# Patient Record
Sex: Male | Born: 1940 | Race: White | Hispanic: No | Smoking: Former smoker
Health system: Southern US, Community
[De-identification: ages and names within clinical notes are randomized; demographics above are authoritative.]

## PROBLEM LIST (undated history)

## (undated) DIAGNOSIS — C801 Malignant (primary) neoplasm, unspecified: Secondary | ICD-10-CM

## (undated) DIAGNOSIS — F0390 Unspecified dementia without behavioral disturbance: Secondary | ICD-10-CM

---

## 2000-08-13 ENCOUNTER — Ambulatory Visit (HOSPITAL_COMMUNITY): Admission: RE | Admit: 2000-08-13 | Discharge: 2000-08-13 | Payer: Self-pay | Admitting: Ophthalmology

## 2004-10-31 ENCOUNTER — Ambulatory Visit (HOSPITAL_COMMUNITY): Admission: RE | Admit: 2004-10-31 | Discharge: 2004-10-31 | Payer: Self-pay | Admitting: Internal Medicine

## 2004-11-02 ENCOUNTER — Ambulatory Visit: Payer: Self-pay | Admitting: Internal Medicine

## 2009-12-22 ENCOUNTER — Emergency Department (HOSPITAL_COMMUNITY): Admission: EM | Admit: 2009-12-22 | Discharge: 2009-12-23 | Payer: Self-pay | Admitting: Emergency Medicine

## 2009-12-28 ENCOUNTER — Ambulatory Visit (HOSPITAL_COMMUNITY): Admission: RE | Admit: 2009-12-28 | Discharge: 2009-12-28 | Payer: Self-pay | Admitting: Family Medicine

## 2010-09-25 LAB — DIFFERENTIAL
Basophils Absolute: 0 10*3/uL (ref 0.0–0.1)
Basophils Relative: 0 % (ref 0–1)
Eosinophils Absolute: 0.1 10*3/uL (ref 0.0–0.7)
Eosinophils Relative: 2 % (ref 0–5)
Lymphocytes Relative: 41 % (ref 12–46)
Lymphs Abs: 3.5 10*3/uL (ref 0.7–4.0)
Monocytes Absolute: 0.9 10*3/uL (ref 0.1–1.0)
Monocytes Relative: 11 % (ref 3–12)
Neutro Abs: 4.1 10*3/uL (ref 1.7–7.7)
Neutrophils Relative %: 47 % (ref 43–77)

## 2010-09-25 LAB — BASIC METABOLIC PANEL
BUN: 16 mg/dL (ref 6–23)
Chloride: 109 mEq/L (ref 96–112)
GFR calc Af Amer: >60 mL/min (ref >60)
Potassium: 3.5 mEq/L (ref 3.5–5.1)
Sodium: 137 mEq/L (ref 135–145)

## 2010-09-25 LAB — URINALYSIS, ROUTINE W REFLEX MICROSCOPIC
Bilirubin Urine: NEGATIVE
Glucose, UA: NEGATIVE mg/dL
Ketones, ur: NEGATIVE mg/dL
Leukocytes, UA: NEGATIVE
Nitrite: NEGATIVE
Protein, ur: NEGATIVE mg/dL
Specific Gravity, Urine: 1.025 (ref 1.005–1.030)
Urobilinogen, UA: 0.2 mg/dL (ref 0.0–1.0)
pH: 5 (ref 5.0–8.0)

## 2010-09-25 LAB — HEPATIC FUNCTION PANEL
ALT: 14 U/L (ref 0–53)
AST: 20 U/L (ref 0–37)
Albumin: 3.8 g/dL (ref 3.5–5.2)
Alkaline Phosphatase: 61 U/L (ref 39–117)
Bilirubin, Direct: <0.1 mg/dL (ref 0.0–0.3)
Total Bilirubin: 0.5 mg/dL (ref 0.3–1.2)
Total Protein: 6.6 g/dL (ref 6.0–8.3)

## 2010-09-25 LAB — CBC
HCT: 38.6 % — ABNORMAL LOW (ref 39.0–52.0)
Hemoglobin: 13.2 g/dL (ref 13.0–17.0)
MCHC: 34.3 g/dL (ref 30.0–36.0)
MCV: 97.7 fL (ref 78.0–100.0)
Platelets: 206 10*3/uL (ref 150–400)
RBC: 3.95 MIL/uL — ABNORMAL LOW (ref 4.22–5.81)
RDW: 13 % (ref 11.5–15.5)
WBC: 8.7 10*3/uL (ref 4.0–10.5)

## 2010-09-25 LAB — RPR: RPR Ser Ql: NONREACTIVE

## 2010-09-25 LAB — ETHANOL: Alcohol, Ethyl (B): <5 mg/dL (ref 0–10)

## 2010-09-25 LAB — RAPID URINE DRUG SCREEN, HOSP PERFORMED
Amphetamines: NOT DETECTED
Benzodiazepines: NOT DETECTED
Cocaine: NOT DETECTED
Tetrahydrocannabinol: NOT DETECTED

## 2010-09-25 LAB — TSH: TSH: 1.131 u[IU]/mL (ref 0.350–4.500)

## 2010-11-25 NOTE — Op Note (Signed)
NAME:  Bradley Roman, Bradley Roman NO.:  [id number]   MEDICAL RECORD NO.:  [id number]          PATIENT TYPE:  AMB   LOCATION:  DAY                           FACILITY:  [facility]   PHYSICIAN:  Lionel December, M.D.    DATE OF BIRTH:  1941-06-29   DATE OF PROCEDURE:  10/31/2004  DATE OF DISCHARGE:                                 OPERATIVE REPORT   PROCEDURE:  Total colonoscopy with polypectomy.   INDICATIONS:  [name] is a 70 year old Caucasian male who is here for screening  colonoscopy. His father died recently in his 8s of metastatic malignancy.  He is not sure whether the colon was primary or not, but he will check.   Procedure risks were reviewed with the patient, and informed consent was  obtained.   PREOPERATIVE MEDICATIONS:  Demerol 25 mg IV, Versed 4 mg IV in divided dose.   FINDINGS:  Procedure performed in endoscopy suite. The patient's vital signs  and O2 saturations were monitored during the procedure and remained stable.  The patient was placed in left lateral position, and rectal examination  performed. No abnormality noted on external or digital exam. Olympus  videoscope was placed in rectum and advanced under vision into sigmoid colon  and beyond. Scattered diverticula are noted at sigmoid colon, some of which  are moderate in size. Redundant colon but scope was passed in the cecum  which was identified by appendiceal stump and ileocecal valve. Pictures  taken for the record. There was a small polyp involving the blunt end of  cecum which was easily ablated via cold biopsy. Second polyp was hepatic  flexure which was also ablated in a similar fashion. There was 4 to 5 mm  polyp at distal sigmoid colon which was snared and retrieved for  histological examination. There was another smaller polyp next to it which  was coagulated. There was another 5-mm polyp at rectum which was snared and  retrieved for histologic examination. Scope was retroflexed to examine  anorectal  junction, and anal papilla was noted. Pictures taken for the  record. Endoscope was withdrawn. The patient tolerated the procedure well.   FINAL DIAGNOSES:  1.  Sigmoid colon diverticulosis.  2.  Five small polyps treated, two were cold biopsied, one from cecum,      another one from hepatic flexure. Two were snared, one from the sigmoid      and the second one from the rectum, and fifth polyp was coagulated at      sigmoid colon.  3.  Anal papilla.   RECOMMENDATIONS:  1.  Standard instructions given.  2.  High-fiber diet.  3.  Citrucel or equivalent 1 tablespoon daily.  4.  I will be contacting patient with biopsy results and further      recommendations.      NR/MEDQ  D:  10/31/2004  T:  10/31/2004  Job:  [id number]   cc:   Angus G. Renard Matter, MD  [address]  [place]  [place] [address]  Fax: [phone]

## 2014-11-26 ENCOUNTER — Other Ambulatory Visit (HOSPITAL_COMMUNITY): Payer: Self-pay | Admitting: Family Medicine

## 2014-11-26 DIAGNOSIS — R413 Other amnesia: Secondary | ICD-10-CM

## 2014-11-26 DIAGNOSIS — R29818 Other symptoms and signs involving the nervous system: Secondary | ICD-10-CM

## 2014-11-30 ENCOUNTER — Ambulatory Visit (HOSPITAL_COMMUNITY)
Admission: RE | Admit: 2014-11-30 | Discharge: 2014-11-30 | Disposition: A | Discharge status: Home / Self Care | Payer: Medicare Other | Source: Ambulatory Visit | Attending: Family Medicine | Admitting: Family Medicine

## 2014-11-30 ENCOUNTER — Encounter (HOSPITAL_COMMUNITY): Payer: Self-pay

## 2014-11-30 DIAGNOSIS — R29818 Other symptoms and signs involving the nervous system: Secondary | ICD-10-CM | POA: Insufficient documentation

## 2014-11-30 DIAGNOSIS — R413 Other amnesia: Secondary | ICD-10-CM | POA: Insufficient documentation

## 2015-01-19 ENCOUNTER — Ambulatory Visit (INDEPENDENT_AMBULATORY_CARE_PROVIDER_SITE_OTHER): Payer: Medicare Other | Admitting: Urology

## 2015-01-19 DIAGNOSIS — R972 Elevated prostate specific antigen [PSA]: Secondary | ICD-10-CM

## 2015-01-19 DIAGNOSIS — N402 Nodular prostate without lower urinary tract symptoms: Secondary | ICD-10-CM

## 2015-01-26 ENCOUNTER — Other Ambulatory Visit: Payer: Self-pay | Admitting: Urology

## 2015-01-26 DIAGNOSIS — R972 Elevated prostate specific antigen [PSA]: Secondary | ICD-10-CM

## 2015-02-01 ENCOUNTER — Other Ambulatory Visit: Payer: Self-pay | Admitting: Urology

## 2015-02-01 DIAGNOSIS — R972 Elevated prostate specific antigen [PSA]: Secondary | ICD-10-CM

## 2015-02-02 ENCOUNTER — Ambulatory Visit (HOSPITAL_COMMUNITY): Admission: RE | Admit: 2015-02-02 | Payer: Medicare Other | Source: Ambulatory Visit

## 2015-02-23 ENCOUNTER — Ambulatory Visit (HOSPITAL_COMMUNITY)
Admission: RE | Admit: 2015-02-23 | Discharge: 2015-02-23 | Disposition: A | Discharge status: Home / Self Care | Payer: Medicare Other | Source: Ambulatory Visit | Attending: Urology | Admitting: Urology

## 2015-02-23 DIAGNOSIS — R972 Elevated prostate specific antigen [PSA]: Secondary | ICD-10-CM

## 2015-02-23 DIAGNOSIS — C61 Malignant neoplasm of prostate: Secondary | ICD-10-CM | POA: Insufficient documentation

## 2015-02-23 MED ORDER — GENTAMICIN SULFATE 40 MG/ML IJ SOLN
INTRAMUSCULAR | Status: AC
  Administered 2015-02-23: 160 mg via INTRAMUSCULAR
  Filled 2015-02-23: qty 4

## 2015-02-23 MED ORDER — LIDOCAINE HCL (PF) 2 % IJ SOLN
INTRAMUSCULAR | Status: AC
  Administered 2015-02-23: 10 mL
  Filled 2015-02-23: qty 10

## 2015-02-23 MED ORDER — LIDOCAINE HCL (PF) 2 % IJ SOLN
10.0000 mL | Freq: Once | INTRAMUSCULAR | Status: AC
  Administered 2015-02-23: 10 mL

## 2015-02-23 MED ORDER — GENTAMICIN SULFATE 40 MG/ML IJ SOLN
160.0000 mg | Freq: Once | INTRAMUSCULAR | Status: AC
  Administered 2015-02-23: 160 mg via INTRAMUSCULAR

## 2015-02-23 NOTE — Discharge Instructions (Signed)
Transrectal Ultrasound-Guided Biopsy °A transrectal ultrasound-guided biopsy is a procedure to remove samples of tissue from your prostate using ultrasound images to guide the procedure. The procedure is usually done to evaluate the prostate gland of men who have an elevated prostate-specific antigen (PSA). PSA is a blood test to screen for prostate cancer. The biopsy samples are taken to check for prostate cancer.  °LET YOUR HEALTH CARE PROVIDER KNOW ABOUT: °· Any allergies you have. °· All medicines you are taking, including vitamins, herbs, eye drops, creams, and over-the-counter medicines. °· Previous problems you or members of your family have had with the use of anesthetics. °· Any blood disorders you have. °· Previous surgeries you have had. °· Medical conditions you have. °RISKS AND COMPLICATIONS °Generally, this is a safe procedure. However, as with any procedure, problems can occur. Possible problems include: °· Infection of your prostate. °· Bleeding from your rectum or blood in your urine. °· Difficulty urinating. °· Nerve damage (this is usually temporary). °· Damage to surrounding structures such as blood vessels, organs, and muscles, which would require other procedures. °BEFORE THE PROCEDURE °· Do not eat or drink anything after midnight on the night before the procedure or as directed by your health care provider. °· Take medicines only as directed by your health care provider. °· Your health care provider may have you stop taking certain medicines 5-7 days before the procedure. °· You will be given an enema before the procedure. During an enema, a liquid is injected into your rectum to clear out waste. °· You may have lab tests the day of your procedure.   °· Plan to have someone take you home after the procedure. °PROCEDURE  °· You will be given medicine to help you relax (sedative) before the procedure. An IV tube will be inserted into one of your veins and used to give fluids and  medicine. °· You will be given antibiotic medicine to reduce the risk of an infection. °· You will be placed on your side for the procedure. °· A probe with lubricated gel will be placed into your rectum, and images will be taken of your prostate and surrounding structures. °· Numbing medicine will be injected into the prostate before the biopsy samples are taken. °· A biopsy needle will then be inserted and guided to your prostate with the use of the ultrasound images. °· Samples of prostate tissue will be taken, and the needle will then be removed. °· The biopsy samples will be sent to a lab to be analyzed. Results are usually back in 2-3 days. °AFTER THE PROCEDURE °· You will be taken to a recovery area where you will be monitored. °· You may have some discomfort in the rectal area. You will be given pain medicines to control this. °· You may be allowed to go home the same day, or you may need to stay in the hospital overnight. °Document Released: 11/10/2013 Document Reviewed: 02/12/2013 °ExitCare® Patient Information ©2015 [employer]. This information is not intended to replace advice given to you by your health care provider. Make sure you discuss any questions you have with your health care provider. ° °

## 2015-03-10 ENCOUNTER — Other Ambulatory Visit: Payer: Self-pay | Admitting: Urology

## 2015-03-10 DIAGNOSIS — C61 Malignant neoplasm of prostate: Secondary | ICD-10-CM

## 2015-03-17 ENCOUNTER — Ambulatory Visit (HOSPITAL_COMMUNITY)
Admission: RE | Admit: 2015-03-17 | Discharge: 2015-03-17 | Disposition: A | Discharge status: Home / Self Care | Payer: Medicare Other | Source: Ambulatory Visit | Attending: Urology | Admitting: Urology

## 2015-03-17 DIAGNOSIS — C61 Malignant neoplasm of prostate: Secondary | ICD-10-CM | POA: Diagnosis not present

## 2015-03-17 DIAGNOSIS — K4090 Unilateral inguinal hernia, without obstruction or gangrene, not specified as recurrent: Secondary | ICD-10-CM | POA: Diagnosis not present

## 2015-03-17 DIAGNOSIS — K5730 Diverticulosis of large intestine without perforation or abscess without bleeding: Secondary | ICD-10-CM | POA: Diagnosis not present

## 2015-03-17 DIAGNOSIS — R972 Elevated prostate specific antigen [PSA]: Secondary | ICD-10-CM | POA: Insufficient documentation

## 2015-03-17 DIAGNOSIS — N400 Enlarged prostate without lower urinary tract symptoms: Secondary | ICD-10-CM | POA: Diagnosis not present

## 2015-03-17 LAB — POCT I-STAT CREATININE: CREATININE: 1.3 mg/dL — AB (ref 0.61–1.24)

## 2015-03-17 MED ORDER — IOHEXOL 300 MG/ML  SOLN
100.0000 mL | Freq: Once | INTRAMUSCULAR | Status: AC | PRN
  Administered 2015-03-17: 100 mL via INTRAVENOUS

## 2015-03-18 ENCOUNTER — Encounter (HOSPITAL_COMMUNITY)
Admission: RE | Admit: 2015-03-18 | Discharge: 2015-03-18 | Disposition: A | Discharge status: Home / Self Care | Payer: Medicare Other | Source: Ambulatory Visit | Attending: Urology | Admitting: Urology

## 2015-03-18 ENCOUNTER — Ambulatory Visit (HOSPITAL_COMMUNITY): Payer: Medicare Other

## 2015-03-18 ENCOUNTER — Encounter (HOSPITAL_COMMUNITY): Payer: Self-pay

## 2015-03-18 ENCOUNTER — Encounter (HOSPITAL_COMMUNITY): Payer: Medicare Other

## 2015-03-18 DIAGNOSIS — C61 Malignant neoplasm of prostate: Secondary | ICD-10-CM | POA: Diagnosis present

## 2015-03-18 HISTORY — DX: Malignant (primary) neoplasm, unspecified: C80.1

## 2015-03-18 MED ORDER — TECHNETIUM TC 99M MEDRONATE IV KIT
25.0000 | PACK | Freq: Once | INTRAVENOUS | Status: AC | PRN
  Administered 2015-03-18: 25 via INTRAVENOUS

## 2015-05-04 ENCOUNTER — Ambulatory Visit (INDEPENDENT_AMBULATORY_CARE_PROVIDER_SITE_OTHER): Payer: Medicare Other | Admitting: Urology

## 2015-05-04 DIAGNOSIS — C61 Malignant neoplasm of prostate: Secondary | ICD-10-CM | POA: Diagnosis not present

## 2015-06-08 ENCOUNTER — Ambulatory Visit (INDEPENDENT_AMBULATORY_CARE_PROVIDER_SITE_OTHER): Payer: Medicare Other | Admitting: Urology

## 2015-06-08 DIAGNOSIS — C61 Malignant neoplasm of prostate: Secondary | ICD-10-CM

## 2015-07-16 ENCOUNTER — Other Ambulatory Visit: Payer: Self-pay | Admitting: Urology

## 2015-07-16 DIAGNOSIS — C61 Malignant neoplasm of prostate: Secondary | ICD-10-CM

## 2015-07-24 ENCOUNTER — Inpatient Hospital Stay (HOSPITAL_COMMUNITY): Payer: Medicare Other

## 2015-07-24 ENCOUNTER — Encounter (HOSPITAL_COMMUNITY)
Admission: EM | Disposition: A | Discharge status: Skilled Nursing Facility | Payer: Self-pay | Source: Home / Self Care | Attending: Internal Medicine

## 2015-07-24 ENCOUNTER — Inpatient Hospital Stay (HOSPITAL_COMMUNITY): Payer: Medicare Other | Admitting: Anesthesiology

## 2015-07-24 ENCOUNTER — Emergency Department (HOSPITAL_COMMUNITY): Payer: Medicare Other

## 2015-07-24 ENCOUNTER — Inpatient Hospital Stay (HOSPITAL_COMMUNITY)
Admission: EM | Admit: 2015-07-24 | Discharge: 2015-07-27 | DRG: 481 | Disposition: A | Discharge status: Skilled Nursing Facility | Payer: Medicare Other | Attending: Internal Medicine | Admitting: Internal Medicine

## 2015-07-24 ENCOUNTER — Encounter (HOSPITAL_COMMUNITY): Payer: Self-pay | Admitting: Emergency Medicine

## 2015-07-24 DIAGNOSIS — C61 Malignant neoplasm of prostate: Secondary | ICD-10-CM | POA: Diagnosis present

## 2015-07-24 DIAGNOSIS — F0390 Unspecified dementia without behavioral disturbance: Secondary | ICD-10-CM | POA: Diagnosis present

## 2015-07-24 DIAGNOSIS — S72142D Displaced intertrochanteric fracture of left femur, subsequent encounter for closed fracture with routine healing: Secondary | ICD-10-CM | POA: Diagnosis not present

## 2015-07-24 DIAGNOSIS — E1165 Type 2 diabetes mellitus with hyperglycemia: Secondary | ICD-10-CM | POA: Diagnosis present

## 2015-07-24 DIAGNOSIS — R739 Hyperglycemia, unspecified: Secondary | ICD-10-CM | POA: Diagnosis present

## 2015-07-24 DIAGNOSIS — Z87891 Personal history of nicotine dependence: Secondary | ICD-10-CM

## 2015-07-24 DIAGNOSIS — S72142A Displaced intertrochanteric fracture of left femur, initial encounter for closed fracture: Secondary | ICD-10-CM | POA: Diagnosis present

## 2015-07-24 DIAGNOSIS — Z6830 Body mass index (BMI) 30.0-30.9, adult: Secondary | ICD-10-CM | POA: Diagnosis not present

## 2015-07-24 DIAGNOSIS — M25552 Pain in left hip: Secondary | ICD-10-CM | POA: Diagnosis present

## 2015-07-24 DIAGNOSIS — Z419 Encounter for procedure for purposes other than remedying health state, unspecified: Secondary | ICD-10-CM

## 2015-07-24 DIAGNOSIS — D62 Acute posthemorrhagic anemia: Secondary | ICD-10-CM | POA: Diagnosis not present

## 2015-07-24 DIAGNOSIS — S72002A Fracture of unspecified part of neck of left femur, initial encounter for closed fracture: Secondary | ICD-10-CM

## 2015-07-24 DIAGNOSIS — E669 Obesity, unspecified: Secondary | ICD-10-CM | POA: Diagnosis present

## 2015-07-24 DIAGNOSIS — W010XXA Fall on same level from slipping, tripping and stumbling without subsequent striking against object, initial encounter: Secondary | ICD-10-CM | POA: Diagnosis not present

## 2015-07-24 HISTORY — PX: INTRAMEDULLARY (IM) NAIL INTERTROCHANTERIC: SHX5875

## 2015-07-24 HISTORY — DX: Unspecified dementia, unspecified severity, without behavioral disturbance, psychotic disturbance, mood disturbance, and anxiety: F03.90

## 2015-07-24 LAB — GLUCOSE, CAPILLARY
GLUCOSE-CAPILLARY: 178 mg/dL — AB (ref 65–99)
Glucose-Capillary: 117 mg/dL — ABNORMAL HIGH (ref 65–99)

## 2015-07-24 LAB — CREATININE, SERUM
CREATININE: 1.17 mg/dL (ref 0.61–1.24)
GFR calc Af Amer: >60 mL/min (ref >60)
GFR, EST NON AFRICAN AMERICAN: 59 mL/min — AB (ref >60)

## 2015-07-24 LAB — CBC WITH DIFFERENTIAL/PLATELET
BASOS ABS: 0.1 10*3/uL (ref 0.0–0.1)
Basophils Relative: 1 %
EOS ABS: 0.1 10*3/uL (ref 0.0–0.7)
EOS PCT: 1 %
HCT: 34 % — ABNORMAL LOW (ref 39.0–52.0)
Hemoglobin: 11.4 g/dL — ABNORMAL LOW (ref 13.0–17.0)
LYMPHS PCT: 15 %
Lymphs Abs: 1.6 10*3/uL (ref 0.7–4.0)
MCH: 33.2 pg (ref 26.0–34.0)
MCHC: 33.5 g/dL (ref 30.0–36.0)
MCV: 99.1 fL (ref 78.0–100.0)
Monocytes Absolute: 1.1 10*3/uL — ABNORMAL HIGH (ref 0.1–1.0)
Monocytes Relative: 10 %
NEUTROS PCT: 73 %
Neutro Abs: 8.2 10*3/uL — ABNORMAL HIGH (ref 1.7–7.7)
PLATELETS: 206 10*3/uL (ref 150–400)
RBC: 3.43 MIL/uL — AB (ref 4.22–5.81)
RDW: 13 % (ref 11.5–15.5)
WBC: 11 10*3/uL — AB (ref 4.0–10.5)

## 2015-07-24 LAB — URINALYSIS, ROUTINE W REFLEX MICROSCOPIC
BILIRUBIN URINE: NEGATIVE
Glucose, UA: 100 mg/dL — AB
LEUKOCYTES UA: NEGATIVE
NITRITE: NEGATIVE
PH: 7 (ref 5.0–8.0)
Protein, ur: 100 mg/dL — AB
SPECIFIC GRAVITY, URINE: 1.02 (ref 1.005–1.030)

## 2015-07-24 LAB — BASIC METABOLIC PANEL
Anion gap: 8 (ref 5–15)
BUN: 14 mg/dL (ref 6–20)
CO2: 27 mmol/L (ref 22–32)
CREATININE: 1.06 mg/dL (ref 0.61–1.24)
Calcium: 8.6 mg/dL — ABNORMAL LOW (ref 8.9–10.3)
Chloride: 107 mmol/L (ref 101–111)
Glucose, Bld: 211 mg/dL — ABNORMAL HIGH (ref 65–99)
POTASSIUM: 3.6 mmol/L (ref 3.5–5.1)
SODIUM: 142 mmol/L (ref 135–145)

## 2015-07-24 LAB — TYPE AND SCREEN
ABO/RH(D): O POS
ANTIBODY SCREEN: NEGATIVE

## 2015-07-24 LAB — CBC
HEMATOCRIT: 31 % — AB (ref 39.0–52.0)
HEMOGLOBIN: 11 g/dL — AB (ref 13.0–17.0)
MCH: 35.3 pg — AB (ref 26.0–34.0)
MCHC: 35.5 g/dL (ref 30.0–36.0)
MCV: 99.4 fL (ref 78.0–100.0)
Platelets: 331 10*3/uL (ref 150–400)
RBC: 3.12 MIL/uL — ABNORMAL LOW (ref 4.22–5.81)
RDW: 14.1 % (ref 11.5–15.5)
WBC: 13.5 10*3/uL — ABNORMAL HIGH (ref 4.0–10.5)

## 2015-07-24 LAB — URINE MICROSCOPIC-ADD ON

## 2015-07-24 LAB — PROTIME-INR
INR: 1.12 (ref 0.00–1.49)
Prothrombin Time: 14.6 seconds (ref 11.6–15.2)

## 2015-07-24 SURGERY — FIXATION, FRACTURE, INTERTROCHANTERIC, WITH INTRAMEDULLARY ROD
Anesthesia: General | Laterality: Left

## 2015-07-24 MED ORDER — EPHEDRINE SULFATE 50 MG/ML IJ SOLN
INTRAMUSCULAR | Status: AC
  Filled 2015-07-24: qty 1

## 2015-07-24 MED ORDER — ACETAMINOPHEN 325 MG PO TABS: 650.0000 mg | ORAL_TABLET | Freq: Four times a day (QID) | ORAL | Status: DC | PRN

## 2015-07-24 MED ORDER — ACETAMINOPHEN 650 MG RE SUPP: 650.0000 mg | Freq: Four times a day (QID) | RECTAL | Status: DC | PRN

## 2015-07-24 MED ORDER — HYDROCODONE-ACETAMINOPHEN 5-325 MG PO TABS
1.0000 | ORAL_TABLET | Freq: Four times a day (QID) | ORAL | Status: DC | PRN
  Administered 2015-07-25 – 2015-07-27 (×3): 1 via ORAL
  Filled 2015-07-24 (×3): qty 1

## 2015-07-24 MED ORDER — PROPOFOL 10 MG/ML IV BOLUS
INTRAVENOUS | Status: DC | PRN
  Administered 2015-07-24: 120 mg via INTRAVENOUS

## 2015-07-24 MED ORDER — ONDANSETRON HCL 4 MG/2ML IJ SOLN: 4.0000 mg | Freq: Three times a day (TID) | INTRAMUSCULAR | Status: DC | PRN

## 2015-07-24 MED ORDER — SODIUM CHLORIDE 0.9 % IJ SOLN
INTRAMUSCULAR | Status: AC
  Filled 2015-07-24: qty 10

## 2015-07-24 MED ORDER — BACITRACIN ZINC 500 UNIT/GM EX OINT
TOPICAL_OINTMENT | CUTANEOUS | Status: AC
  Administered 2015-07-24: 1 via TOPICAL
  Filled 2015-07-24: qty 0.9

## 2015-07-24 MED ORDER — ONDANSETRON HCL 4 MG/2ML IJ SOLN
INTRAMUSCULAR | Status: DC | PRN
  Administered 2015-07-24: 4 mg via INTRAVENOUS

## 2015-07-24 MED ORDER — FENTANYL CITRATE (PF) 100 MCG/2ML IJ SOLN
50.0000 ug | INTRAMUSCULAR | Status: DC | PRN
  Administered 2015-07-24: 50 ug via INTRAVENOUS
  Filled 2015-07-24: qty 2

## 2015-07-24 MED ORDER — ACETAMINOPHEN 325 MG PO TABS
650.0000 mg | ORAL_TABLET | Freq: Four times a day (QID) | ORAL | Status: DC | PRN
  Administered 2015-07-25 – 2015-07-27 (×3): 650 mg via ORAL
  Filled 2015-07-24 (×3): qty 2

## 2015-07-24 MED ORDER — HYDROMORPHONE HCL 1 MG/ML IJ SOLN: 0.2500 mg | INTRAMUSCULAR | Status: DC | PRN

## 2015-07-24 MED ORDER — SUFENTANIL CITRATE 50 MCG/ML IV SOLN
INTRAVENOUS | Status: AC
  Filled 2015-07-24: qty 1

## 2015-07-24 MED ORDER — SODIUM CHLORIDE 0.9 % IV SOLN
INTRAVENOUS | Status: DC
  Administered 2015-07-25: via INTRAVENOUS

## 2015-07-24 MED ORDER — SODIUM CHLORIDE 0.9 % IV SOLN
1000.0000 mL | INTRAVENOUS | Status: DC
  Administered 2015-07-24: 1000 mL via INTRAVENOUS

## 2015-07-24 MED ORDER — ENOXAPARIN SODIUM 40 MG/0.4ML ~~LOC~~ SOLN: 40.0000 mg | SUBCUTANEOUS | Status: DC

## 2015-07-24 MED ORDER — PROMETHAZINE HCL 25 MG/ML IJ SOLN: 6.2500 mg | INTRAMUSCULAR | Status: DC | PRN

## 2015-07-24 MED ORDER — INSULIN ASPART 100 UNIT/ML ~~LOC~~ SOLN
0.0000 [IU] | Freq: Three times a day (TID) | SUBCUTANEOUS | Status: DC
  Administered 2015-07-25: 2 [IU] via SUBCUTANEOUS

## 2015-07-24 MED ORDER — LIDOCAINE HCL (CARDIAC) 20 MG/ML IV SOLN
INTRAVENOUS | Status: AC
  Filled 2015-07-24: qty 5

## 2015-07-24 MED ORDER — DONEPEZIL HCL 10 MG PO TABS
10.0000 mg | ORAL_TABLET | Freq: Every day | ORAL | Status: DC
  Administered 2015-07-25 – 2015-07-26 (×2): 10 mg via ORAL
  Filled 2015-07-24 (×2): qty 1

## 2015-07-24 MED ORDER — MORPHINE SULFATE (PF) 2 MG/ML IV SOLN: 1.0000 mg | INTRAVENOUS | Status: DC | PRN

## 2015-07-24 MED ORDER — PHENYLEPHRINE 40 MCG/ML (10ML) SYRINGE FOR IV PUSH (FOR BLOOD PRESSURE SUPPORT)
PREFILLED_SYRINGE | INTRAVENOUS | Status: AC
  Filled 2015-07-24: qty 10

## 2015-07-24 MED ORDER — PHENYLEPHRINE HCL 10 MG/ML IJ SOLN
INTRAMUSCULAR | Status: DC | PRN
  Administered 2015-07-24 (×2): 80 ug via INTRAVENOUS
  Administered 2015-07-24: 160 ug via INTRAVENOUS
  Administered 2015-07-24: 80 ug via INTRAVENOUS

## 2015-07-24 MED ORDER — ONDANSETRON HCL 4 MG PO TABS: 4.0000 mg | ORAL_TABLET | Freq: Four times a day (QID) | ORAL | Status: DC | PRN

## 2015-07-24 MED ORDER — ONDANSETRON HCL 4 MG/2ML IJ SOLN
INTRAMUSCULAR | Status: AC
  Filled 2015-07-24: qty 2

## 2015-07-24 MED ORDER — 0.9 % SODIUM CHLORIDE (POUR BTL) OPTIME
TOPICAL | Status: DC | PRN
  Administered 2015-07-24: 1000 mL

## 2015-07-24 MED ORDER — FENTANYL CITRATE (PF) 100 MCG/2ML IJ SOLN
50.0000 ug | Freq: Once | INTRAMUSCULAR | Status: AC
  Administered 2015-07-24: 50 ug via INTRAVENOUS
  Filled 2015-07-24: qty 2

## 2015-07-24 MED ORDER — SUFENTANIL CITRATE 50 MCG/ML IV SOLN
INTRAVENOUS | Status: DC | PRN
  Administered 2015-07-24: 15 ug via INTRAVENOUS
  Administered 2015-07-24: 5 ug via INTRAVENOUS

## 2015-07-24 MED ORDER — PROPOFOL 10 MG/ML IV BOLUS
INTRAVENOUS | Status: AC
  Filled 2015-07-24: qty 20

## 2015-07-24 MED ORDER — LACTATED RINGERS IV SOLN
INTRAVENOUS | Status: DC | PRN
  Administered 2015-07-24 (×2): via INTRAVENOUS

## 2015-07-24 MED ORDER — INSULIN ASPART 100 UNIT/ML ~~LOC~~ SOLN: 0.0000 [IU] | Freq: Every day | SUBCUTANEOUS | Status: DC

## 2015-07-24 MED ORDER — SUCCINYLCHOLINE CHLORIDE 20 MG/ML IJ SOLN
INTRAMUSCULAR | Status: DC | PRN
  Administered 2015-07-24: 100 mg via INTRAVENOUS

## 2015-07-24 MED ORDER — SUCCINYLCHOLINE CHLORIDE 20 MG/ML IJ SOLN
INTRAMUSCULAR | Status: AC
  Filled 2015-07-24: qty 1

## 2015-07-24 MED ORDER — SODIUM CHLORIDE 0.9 % IV SOLN: INTRAVENOUS | Status: DC

## 2015-07-24 MED ORDER — FENTANYL CITRATE (PF) 100 MCG/2ML IJ SOLN: 50.0000 ug | INTRAMUSCULAR | Status: DC | PRN

## 2015-07-24 MED ORDER — ENOXAPARIN SODIUM 40 MG/0.4ML ~~LOC~~ SOLN
40.0000 mg | SUBCUTANEOUS | Status: DC
  Administered 2015-07-25 – 2015-07-27 (×3): 40 mg via SUBCUTANEOUS
  Filled 2015-07-24 (×3): qty 0.4

## 2015-07-24 MED ORDER — DEXAMETHASONE SODIUM PHOSPHATE 4 MG/ML IJ SOLN
INTRAMUSCULAR | Status: AC
  Filled 2015-07-24: qty 1

## 2015-07-24 MED ORDER — CEFAZOLIN SODIUM-DEXTROSE 2-3 GM-% IV SOLR
INTRAVENOUS | Status: DC | PRN
  Administered 2015-07-24: 2 g via INTRAVENOUS

## 2015-07-24 MED ORDER — ONDANSETRON HCL 4 MG/2ML IJ SOLN
4.0000 mg | Freq: Once | INTRAMUSCULAR | Status: AC
  Administered 2015-07-24: 4 mg via INTRAVENOUS
  Filled 2015-07-24: qty 2

## 2015-07-24 MED ORDER — LIDOCAINE HCL (CARDIAC) 20 MG/ML IV SOLN
INTRAVENOUS | Status: DC | PRN
  Administered 2015-07-24: 100 mg via INTRAVENOUS

## 2015-07-24 MED ORDER — DEXAMETHASONE SODIUM PHOSPHATE 4 MG/ML IJ SOLN
INTRAMUSCULAR | Status: DC | PRN
Start: 2015-07-24 — End: 2015-07-24
  Administered 2015-07-24: 4 mg via INTRAVENOUS

## 2015-07-24 MED ORDER — ONDANSETRON HCL 4 MG/2ML IJ SOLN: 4.0000 mg | Freq: Four times a day (QID) | INTRAMUSCULAR | Status: DC | PRN

## 2015-07-24 MED ORDER — BACITRACIN ZINC 500 UNIT/GM EX OINT
TOPICAL_OINTMENT | Freq: Once | CUTANEOUS | Status: AC
  Administered 2015-07-24: 1 via TOPICAL

## 2015-07-24 MED ORDER — ALUM & MAG HYDROXIDE-SIMETH 200-200-20 MG/5ML PO SUSP: 30.0000 mL | Freq: Four times a day (QID) | ORAL | Status: DC | PRN

## 2015-07-24 MED ORDER — POLYETHYLENE GLYCOL 3350 17 G PO PACK: 17.0000 g | PACK | Freq: Every day | ORAL | Status: DC | PRN

## 2015-07-24 SURGICAL SUPPLY — 39 items
BIT DRILL CANN LG 4.3MM (BIT) IMPLANT
BLADE SURG 15 STRL LF DISP TIS (BLADE) ×1 IMPLANT
BLADE SURG 15 STRL SS (BLADE) ×2
CANISTER SUCT 3000ML PPV (MISCELLANEOUS) ×2 IMPLANT
CHLORAPREP W/TINT 26ML (MISCELLANEOUS) ×2 IMPLANT
COVER MAYO STAND STRL (DRAPES) ×2 IMPLANT
COVER PERINEAL POST (MISCELLANEOUS) ×2 IMPLANT
COVER SURGICAL LIGHT HANDLE (MISCELLANEOUS) ×2 IMPLANT
DRAPE STERI IOBAN 125X83 (DRAPES) ×3 IMPLANT
DRAPE U-SHAPE 47X51 STRL (DRAPES) ×2 IMPLANT
DRILL BIT CANN LG 4.3MM (BIT) ×2
DRSG MEPILEX BORDER 4X4 (GAUZE/BANDAGES/DRESSINGS) ×3 IMPLANT
DRSG MEPILEX BORDER 4X8 (GAUZE/BANDAGES/DRESSINGS) ×2 IMPLANT
ELECT REM PT RETURN 9FT ADLT (ELECTROSURGICAL) ×2
ELECTRODE REM PT RTRN 9FT ADLT (ELECTROSURGICAL) ×1 IMPLANT
GLOVE BIO SURGEON STRL SZ7 (GLOVE) ×2 IMPLANT
GLOVE BIO SURGEON STRL SZ8 (GLOVE) ×2 IMPLANT
GLOVE BIOGEL PI IND STRL 8 (GLOVE) ×1 IMPLANT
GLOVE BIOGEL PI INDICATOR 8 (GLOVE) ×1
GOWN STRL REUS W/ TWL LRG LVL3 (GOWN DISPOSABLE) ×1 IMPLANT
GOWN STRL REUS W/ TWL XL LVL3 (GOWN DISPOSABLE) ×2 IMPLANT
GOWN STRL REUS W/TWL LRG LVL3 (GOWN DISPOSABLE) ×2
GOWN STRL REUS W/TWL XL LVL3 (GOWN DISPOSABLE) ×4
KIT BASIN OR (CUSTOM PROCEDURE TRAY) ×2 IMPLANT
KIT ROOM TURNOVER OR (KITS) ×2 IMPLANT
LINER BOOT UNIVERSAL DISP (MISCELLANEOUS) ×2 IMPLANT
NAIL HIP FRACT 130D 11X180 (Screw) ×1 IMPLANT
NS IRRIG 1000ML POUR BTL (IV SOLUTION) ×2 IMPLANT
PACK GENERAL/GYN (CUSTOM PROCEDURE TRAY) ×2 IMPLANT
PAD ARMBOARD 7.5X6 YLW CONV (MISCELLANEOUS) ×4 IMPLANT
SCREW BONE CORTICAL 5.0X38 (Screw) ×1 IMPLANT
SCREW LAG 10.5MMX105MM HFN (Screw) ×1 IMPLANT
SPONGE LAP 18X18 X RAY DECT (DISPOSABLE) ×2 IMPLANT
STAPLER VISISTAT 35W (STAPLE) ×4 IMPLANT
SUT MNCRL AB 3-0 PS2 18 (SUTURE) ×2 IMPLANT
SUT VIC AB 0 CT1 27 (SUTURE) ×2
SUT VIC AB 0 CT1 27XBRD ANBCTR (SUTURE) ×1 IMPLANT
TRAY FOLEY CATH 16FRSI W/METER (SET/KITS/TRAYS/PACK) IMPLANT
WATER STERILE IRR 1000ML POUR (IV SOLUTION) ×2 IMPLANT

## 2015-07-24 NOTE — Progress Notes (Signed)
Attempted to contact emergency contact with no answer. Contacted Mindy, RN at [facility] to determine if there was an alternate number they had. She reports no alternate number. She called back and stated she had attempted to contact emergency contact 3 times without success, however she did leave my number to call back. OR charge RN Angie made aware of same and will notify Dr. Doran Durand.

## 2015-07-24 NOTE — H&P (Addendum)
History and Physical  Bradley Roman [id number] DOB: Feb 06, 1941 DOA: 07/24/2015  Referring physician: Dr Sabra Heck, ED physician PCP: Lanette Hampshire, MD   Chief Complaint: Hip pain  HPI: Bradley Roman is a 75 y.o. male  With a history of severe dementia. As such the patient is unable to provide details of history. History is provided by the medical record, nursing notes, ED note. Patient was walking and slipped striking his left hip and left elbow when he stepped down off the curb. He had acute onset of pain and had difficulty getting up off the ground. As patient had persistent pain in his left hip, the patient was brought to the hospital for evaluation. His left leg is shortened and externally rotated. Symptoms worse with range of motion and improved with rest.   Review of Systems:  Unreliable as the patient is severe dementia but patient denies chest pain, shortness of breath, headache.   Past Medical History  Diagnosis Date  . Cancer ([place])   . Dementia    History reviewed. No pertinent past surgical history. Social History:  reports that he has quit smoking. He does not have any smokeless tobacco history on file. He reports that he does not drink alcohol. His drug history is not on file. Patient lives at home with his daughter and is  not able to participate in activities of daily living   No Known Allergies  Patient unable to provide medical history   Prior to Admission medications   Medication Sig Start Date End Date Taking? Authorizing Provider  donepezil (ARICEPT) 10 MG tablet Take 1 tablet by mouth at bedtime. 06/27/15  Yes Historical Provider, MD    Physical Exam: BP 148/71 mmHg  Pulse 85  Temp(Src) 98.4 F (36.9 C) (Oral)  Resp 14  Ht 5\' 9"  (1.753 m)  Wt 92.987 kg (205 lb)  BMI 30.26 kg/m2  SpO2 99%  General:  pleasant 75 year old male.  Awake and alert and oriented to person.  is not oriented to place or time. No acute cardiopulmonary distress.  Eyes:  Pupils equal, round, reactive to light. Extraocular muscles are intact. Sclerae anicteric and noninjected.  ENT: Moist mucosal membranes. No mucosal lesions.  Neck: Neck supple without lymphadenopathy. No carotid bruits. No masses palpated.  Cardiovascular: Regular rate with normal S1-S2 sounds. No murmurs, rubs, gallops auscultated. No JVD.  Respiratory: Good respiratory effort with no wheezes, rales, rhonchi. Lungs clear to auscultation bilaterally.  Abdomen: Soft, nontender, nondistended. Active bowel sounds. No masses or hepatosplenomegaly  Skin: Dry, warm to touch. 2+ dorsalis pedis and radial pulses. Musculoskeletal: left hip pain.  left leg shortened and externally rotated. All  other major joints not erythematous nontender.  Psychiatric: Unable to determine  Neurologic: No focal neurological deficits. Cranial nerves II through XII are grossly intact.           Labs on Admission:  Basic Metabolic Panel:  Recent Labs Lab 07/24/15 1324  NA 142  K 3.6  CL 107  CO2 27  GLUCOSE 211*  BUN 14  CREATININE 1.06  CALCIUM 8.6*   Liver Function Tests: No results for input(s): AST, ALT, ALKPHOS, BILITOT, PROT, ALBUMIN in the last 168 hours. No results for input(s): LIPASE, AMYLASE in the last 168 hours. No results for input(s): AMMONIA in the last 168 hours. CBC:  Recent Labs Lab 07/24/15 1324  WBC 11.0*  NEUTROABS 8.2*  HGB 11.4*  HCT 34.0*  MCV 99.1  PLT 206   Cardiac Enzymes: No  results for input(s): CKTOTAL, CKMB, CKMBINDEX, TROPONINI in the last 168 hours.  BNP (last 3 results) No results for input(s): BNP in the last 8760 hours.  ProBNP (last 3 results) No results for input(s): PROBNP in the last 8760 hours.  CBG: No results for input(s): GLUCAP in the last 168 hours.  Radiological Exams on Admission: Dg Chest 1 View  07/24/2015  CLINICAL DATA:  Left hip fracture.  Preop respiratory exam. EXAM: CHEST 1 VIEW COMPARISON:  12/22/2009 FINDINGS: Low lung volumes  are noted. Heart size is within normal limits. No evidence pulmonary infiltrate, pleural effusion, or pneumothorax. IMPRESSION: Low lung volumes.  No active disease. Electronically Signed   By: Earle Gell M.D.   On: 07/24/2015 14:14   Dg Hip Unilat With Pelvis 2-3 Views Left  07/24/2015  CLINICAL DATA:  Pain following fall after tripping over a curb EXAM: DG HIP (WITH OR WITHOUT PELVIS) 2-3V LEFT COMPARISON:  None. FINDINGS: Frontal pelvis as well as frontal and lateral left hip images were obtained. There is a comminuted intertrochanteric femur fracture with varus angulation at the fracture site. There is avulsion of the lesser trochanter. No other fractures. No dislocations. There is symmetric narrowing of both hip joints. There is degenerative change in the lower lumbar region. IMPRESSION: Comminuted fracture, intertrochanteric left femur region with varus angulation at the fracture site and avulsion of the lesser trochanter. No other fractures. No dislocation. Moderate narrowing both hip joints. Electronically Signed   By: Lowella Grip III M.D.   On: 07/24/2015 14:15    EKG: Independently reviewed. Sinus rhythm with heart rate of 91. Normal intervals. Early transition of R-wave. No ST elevation or depression.  Assessment/Plan Present on Admission:  . Hip fracture, left ([place]) . Hyperglycemia . Dementia  This patient was discussed with the ED physician, including pertinent vitals, physical exam findings, labs, and imaging.  We also discussed care given by the ED provider.   #1 hip fracture, left  Admitted to [facility] for [facility]  We'll keep nothing by mouth  Control pain medicine with fentanyl  CBC, CMP in the morning  #2 hyperglycemia   Random blood sugar at 211, patient likely diabetic.  will obtain hemoglobin A1c  Sliding-scale insulin  CBGs before meals and daily at bedtime #3 dementia    into new Aricept  DVT prophylaxis: SCDs, start Lovenox tomorrow if  amenable by ortho  Consultants: orthopedic surgery   Code Status: presumed full code   Family Communication: I attempted to call the patient's daughter, who is the closest living relative. I wasn't able to contact her, but left a message    Disposition Plan: med surge at [facility] as there is no orthopedic coverage at this hospital   Truett Mainland, DO [facility] Pager [phone]

## 2015-07-24 NOTE — ED Provider Notes (Signed)
CSN: [id number]     Arrival date & time 07/24/15  1253 History   First MD Initiated Contact with Patient 07/24/15 1303     Chief Complaint  Patient presents with  . Fall     (Consider location/radiation/quality/duration/timing/severity/associated sxs/prior Treatment) HPI  The pt was walking, stepping down a curb, slipped and fell striking his left hip and his left elbow. He had acute onset of pain and the inability to get up off the ground. He currently has persistent pain in the left hip, his leg is shortened and externally rotated. The symptoms are persistent, worse with range of motion, the patient was not immobilized. He denies numbness weakness headache injury chest pain shortness of breath or any other complaints. He states that the reason that he fell was because he slept.  Past Medical History  Diagnosis Date  . Cancer ([place])   . Dementia    History reviewed. No pertinent past surgical history. No family history on file. Social History  Substance Use Topics  . Smoking status: Former Research scientist (life sciences)  . Smokeless tobacco: None  . Alcohol Use: No    Review of Systems  All other systems reviewed and are negative.     Allergies  Review of patient's allergies indicates no known allergies.  Home Medications   Prior to Admission medications   Medication Sig Start Date End Date Taking? Authorizing Provider  donepezil (ARICEPT) 10 MG tablet Take 1 tablet by mouth at bedtime. 06/27/15  Yes Historical Provider, MD   BP 148/71 mmHg  Pulse 85  Temp(Src) 98.4 F (36.9 C) (Oral)  Resp 14  Ht 5\' 9"  (1.753 m)  Wt 205 lb (92.987 kg)  BMI 30.26 kg/m2  SpO2 99% Physical Exam  Constitutional: He appears well-developed and well-nourished. No distress.  HENT:  Head: Normocephalic and atraumatic.  Mouth/Throat: Oropharynx is clear and moist. No oropharyngeal exudate.  Eyes: Conjunctivae and EOM are normal. Pupils are equal, round, and reactive to light. Right eye exhibits no discharge.  Left eye exhibits no discharge. No scleral icterus.  Neck: Normal range of motion. Neck supple. No JVD present. No thyromegaly present.  Cardiovascular: Normal rate, regular rhythm, normal heart sounds and intact distal pulses.  Exam reveals no gallop and no friction rub.   No murmur heard. Pulmonary/Chest: Effort normal and breath sounds normal. No respiratory distress. He has no wheezes. He has no rales.  Abdominal: Soft. Bowel sounds are normal. He exhibits no distension and no mass. There is no tenderness.  Musculoskeletal: He exhibits tenderness. He exhibits no edema.  Mild tenderness to the left proximal extensor surface of the forearm, 2 small skin tears, normal range of motion of the left elbow. Decreased range of motion of the left hip secondary to shortened and externally rotated lower extremity on the left. Decreased range of motion of the hip, normal knee and ankle joints on that side, normal pulses at the feet bilaterally  Lymphadenopathy:    He has no cervical adenopathy.  Neurological: He is alert. Coordination normal.  Normal sensation to all 4 extremities  Skin: Skin is warm and dry. No rash noted. No erythema.  Psychiatric: He has a normal mood and affect. His behavior is normal.  Nursing note and vitals reviewed.   ED Course  Procedures (including critical care time) Labs Review Labs Reviewed  BASIC METABOLIC PANEL - Abnormal; Notable for the following:    Glucose, Bld 211 (*)    Calcium 8.6 (*)    All other components within  normal limits  CBC WITH DIFFERENTIAL/PLATELET - Abnormal; Notable for the following:    WBC 11.0 (*)    RBC 3.43 (*)    Hemoglobin 11.4 (*)    HCT 34.0 (*)    Neutro Abs 8.2 (*)    Monocytes Absolute 1.1 (*)    All other components within normal limits  URINALYSIS, ROUTINE W REFLEX MICROSCOPIC (NOT AT [facility]) - Abnormal; Notable for the following:    APPearance HAZY (*)    Glucose, UA 100 (*)    Hgb urine dipstick LARGE (*)    Ketones, ur  TRACE (*)    Protein, ur 100 (*)    All other components within normal limits  URINE MICROSCOPIC-ADD ON - Abnormal; Notable for the following:    Squamous Epithelial / LPF 0-5 (*)    Bacteria, UA FEW (*)    Casts GRANULAR CAST (*)    All other components within normal limits  PROTIME-INR  TYPE AND SCREEN    Imaging Review Dg Chest 1 View  07/24/2015  CLINICAL DATA:  Left hip fracture.  Preop respiratory exam. EXAM: CHEST 1 VIEW COMPARISON:  12/22/2009 FINDINGS: Low lung volumes are noted. Heart size is within normal limits. No evidence pulmonary infiltrate, pleural effusion, or pneumothorax. IMPRESSION: Low lung volumes.  No active disease. Electronically Signed   By: Earle Gell M.D.   On: 07/24/2015 14:14   Dg Hip Unilat With Pelvis 2-3 Views Left  07/24/2015  CLINICAL DATA:  Pain following fall after tripping over a curb EXAM: DG HIP (WITH OR WITHOUT PELVIS) 2-3V LEFT COMPARISON:  None. FINDINGS: Frontal pelvis as well as frontal and lateral left hip images were obtained. There is a comminuted intertrochanteric femur fracture with varus angulation at the fracture site. There is avulsion of the lesser trochanter. No other fractures. No dislocations. There is symmetric narrowing of both hip joints. There is degenerative change in the lower lumbar region. IMPRESSION: Comminuted fracture, intertrochanteric left femur region with varus angulation at the fracture site and avulsion of the lesser trochanter. No other fractures. No dislocation. Moderate narrowing both hip joints. Electronically Signed   By: Lowella Grip III M.D.   On: 07/24/2015 14:15   I have personally reviewed and evaluated these images and lab results as part of my medical decision-making.   EKG Interpretation   Date/Time:  Saturday July 24 2015 13:07:35 EST Ventricular Rate:  91 PR Interval:  185 QRS Duration: 98 QT Interval:  361 QTC Calculation: 444 R Axis:   20 Text Interpretation:  Sinus tachycardia Low  voltage, precordial leads  Abnormal R-wave progression, early transition Borderline T abnormalities,  anterior leads Baseline wander in lead(s) V4 Since last tracing T wave  abnormality NOW PRESENT Confirmed by Gisella Alwine  MD, Alayjah Boehringer ([id number]) on  07/24/2015 2:04:22 PM      MDM   Final diagnoses:  Hip fracture, left, closed, initial encounter ([place])    There are no signs of head injury, vital signs are unremarkable, the patient likely had a mechanical fall and now has a fracture of the left hip clinically. We'll obtain imaging, preop x-ray and EKG, labs, anticipate the need for admission and potential transfer to a higher level of care as there is no orthopedic call at this hospital today.'  Imaging reviewed - has intertroch frx of the L - xray chest ok, labs ok, mild hyperglycemia No meds  no anticoag D/w Dr. Doran Durand who will operate today - npo status D/w Dr. Nehemiah Settle who will facility  trasfer to [facility] - holding orders written, EMTALA completed.  Meds given in ED:  Medications  fentaNYL (SUBLIMAZE) injection 50 mcg (50 mcg Intravenous Given 07/24/15 1321)  0.9 %  sodium chloride infusion (1,000 mLs Intravenous New Bag/Given 07/24/15 1323)  fentaNYL (SUBLIMAZE) injection 50 mcg (not administered)  ondansetron (ZOFRAN) injection 4 mg (4 mg Intravenous Given 07/24/15 1320)  bacitracin ointment (1 application Topical Given 07/24/15 1406)        Noemi Chapel, MD 07/24/15 1526

## 2015-07-24 NOTE — Brief Op Note (Signed)
07/24/2015  9:46 PM  PATIENT:  Bradley Roman  75 y.o. male  PRE-OPERATIVE DIAGNOSIS:  left intertroch hip fx  POST-OPERATIVE DIAGNOSIS:  left intertroch hip fx  Procedure(s): Open treatment of left hip intertrochanteric fracture with intramedullary nailing  SURGEON:  Wylene Simmer, MD  ASSISTANT: n/a  ANESTHESIA:   General  EBL:  100 cc  TOURNIQUET:  N/a  COMPLICATIONS:  None apparent  DISPOSITION:  Extubated, awake and stable to recovery.  DICTATION ID:  [id number]

## 2015-07-24 NOTE — Anesthesia Preprocedure Evaluation (Signed)
Anesthesia Evaluation  Patient identified by MRN, date of birth, ID band Patient confused    Reviewed: Allergy & Precautions, Patient's Chart, lab work & pertinent test results, Unable to perform ROS - Chart review only  Airway Mallampati: I  TM Distance: >3 FB Neck ROM: Full    Dental  (+) Edentulous Upper, Edentulous Lower, Dental Advisory Given   Pulmonary former smoker,    Pulmonary exam normal        Cardiovascular negative cardio ROS Normal cardiovascular exam     Neuro/Psych Dementia negative psych ROS   GI/Hepatic negative GI ROS, Neg liver ROS,   Endo/Other  negative endocrine ROS  Renal/GU negative Renal ROS     Musculoskeletal   Abdominal   Peds  Hematology negative hematology ROS (+)   Anesthesia Other Findings   Reproductive/Obstetrics                             Anesthesia Physical Anesthesia Plan  ASA: III and emergent  Anesthesia Plan: General   Post-op Pain Management:    Induction: Intravenous  Airway Management Planned: Oral ETT  Additional Equipment:   Intra-op Plan:   Post-operative Plan: Extubation in OR  Informed Consent: I have reviewed the patients History and Physical, chart, labs and discussed the procedure including the risks, benefits and alternatives for the proposed anesthesia with the patient or authorized representative who has indicated his/her understanding and acceptance.   Dental advisory given  Plan Discussed with: CRNA, Anesthesiologist and Surgeon  Anesthesia Plan Comments:         Anesthesia Quick Evaluation

## 2015-07-24 NOTE — ED Notes (Signed)
Spoke with pt's step-daughter, states pt has hx of dementia, states she is out of town and will not be back until tomorrow. States there is no other family/ caregivers to contact.

## 2015-07-24 NOTE — Progress Notes (Signed)
Patient has eyeglasses and upper/lower dentures at bedside.  Patient has wedding ring.  Forms completed and wedding ring sent to security via [place], [place].  Patient has dementia, OR RN witnessed collection of wedding ring.  OR RN and primary RN signed form.  Please return ring to patient at discharge.

## 2015-07-24 NOTE — ED Notes (Signed)
Pt transported to radiology.

## 2015-07-24 NOTE — Anesthesia Procedure Notes (Signed)
Procedure Name: Intubation Date/Time: 07/24/2015 8:31 PM Performed by: Claris Che Pre-anesthesia Checklist: Patient identified, Emergency Drugs available, Suction available, Patient being monitored and Timeout performed Patient Re-evaluated:Patient Re-evaluated prior to inductionOxygen Delivery Method: Circle system utilized Preoxygenation: Pre-oxygenation with 100% oxygen Intubation Type: IV induction and Cricoid Pressure applied Ventilation: Mask ventilation without difficulty Laryngoscope Size: Mac and 3 Grade View: Grade II Tube type: Oral Tube size: 7.5 mm Number of attempts: 1 Airway Equipment and Method: Stylet Placement Confirmation: ETT inserted through vocal cords under direct vision,  positive ETCO2 and breath sounds checked- equal and bilateral Secured at: 24 cm Tube secured with: Tape Dental Injury: Teeth and Oropharynx as per pre-operative assessment

## 2015-07-24 NOTE — Anesthesia Postprocedure Evaluation (Signed)
Anesthesia Post Note  Patient: Bradley Roman  Procedure(s) Performed: Procedure(s) (LRB): INTRAMEDULLARY (IM) NAIL INTERTROCHANTRIC AFFIXUS NAIL (Left)  Patient location during evaluation: PACU Anesthesia Type: General Level of consciousness: sedated Pain management: pain level controlled Vital Signs Assessment: post-procedure vital signs reviewed and stable Respiratory status: spontaneous breathing and respiratory function stable Cardiovascular status: stable Anesthetic complications: no    Last Vitals:  Filed Vitals:   07/24/15 2215 07/24/15 2230  BP: 173/91 163/84  Pulse: 95 91  Temp:    Resp: 19 10    Last Pain:  Filed Vitals:   07/24/15 2235  PainSc: Asleep                 Miguelangel Korn DANIEL

## 2015-07-24 NOTE — H&P (Signed)
Bradley Roman is an 75 y.o. male.   Chief Complaint:  Left hip pain HPI:  75 y/o male with left hip pain after a fall earlier today.  He was transferred down from [facility] since they don't have ortho coverage this weekend.  By report from his step daughter, he has prostate cancer that is being treated currently.  No h/o diabetes or blood thinners.  He is not a smoker.  He has tried to get out of bed and walk on his broken hip.  There is a Actuary with him now.  I spoke with his step daughter Teddy Spike by phone this evening.  She is out of town.  Ms Johnnye Sima reports that he has no contact with his children, and she has no contact information for them.  She states that he doesn't have a POA.  The patient lives with Ms Jillyn Hidden and her husband.  Past Medical History  Diagnosis Date  . Cancer ([place])   . Dementia     History reviewed. No pertinent past surgical history.  FH:  Non contrib. Social History:  reports that he has quit smoking. He does not have any smokeless tobacco history on file. He reports that he does not drink alcohol. His drug history is not on file.  Allergies: No Known Allergies  Medications Prior to Admission  Medication Sig Dispense Refill  . donepezil (ARICEPT) 10 MG tablet Take 1 tablet by mouth at bedtime.  4    Results for orders placed or performed during the hospital encounter of 07/24/15 (from the past 48 hour(s))  Basic metabolic panel     Status: Abnormal   Collection Time: 07/24/15  1:24 PM  Result Value Ref Range   Sodium 142 135 - 145 mmol/L   Potassium 3.6 3.5 - 5.1 mmol/L   Chloride 107 101 - 111 mmol/L   CO2 27 22 - 32 mmol/L   Glucose, Bld 211 (H) 65 - 99 mg/dL   BUN 14 6 - 20 mg/dL   Creatinine, Ser 1.06 0.61 - 1.24 mg/dL   Calcium 8.6 (L) 8.9 - 10.3 mg/dL   GFR calc non Af Amer >60 >60 mL/min   GFR calc Af Amer >60 >60 mL/min    Comment: (NOTE) The eGFR has been calculated using the CKD EPI equation. This calculation has not been validated  in all clinical situations. eGFR's persistently <60 mL/min signify possible Chronic Kidney Disease.    Anion gap 8 5 - 15  CBC WITH DIFFERENTIAL     Status: Abnormal   Collection Time: 07/24/15  1:24 PM  Result Value Ref Range   WBC 11.0 (H) 4.0 - 10.5 K/uL   RBC 3.43 (L) 4.22 - 5.81 MIL/uL   Hemoglobin 11.4 (L) 13.0 - 17.0 g/dL   HCT 34.0 (L) 39.0 - 52.0 %   MCV 99.1 78.0 - 100.0 fL   MCH 33.2 26.0 - 34.0 pg   MCHC 33.5 30.0 - 36.0 g/dL   RDW 13.0 11.5 - 15.5 %   Platelets 206 150 - 400 K/uL   Neutrophils Relative % 73 %   Neutro Abs 8.2 (H) 1.7 - 7.7 K/uL   Lymphocytes Relative 15 %   Lymphs Abs 1.6 0.7 - 4.0 K/uL   Monocytes Relative 10 %   Monocytes Absolute 1.1 (H) 0.1 - 1.0 K/uL   Eosinophils Relative 1 %   Eosinophils Absolute 0.1 0.0 - 0.7 K/uL   Basophils Relative 1 %   Basophils Absolute 0.1 0.0 -  0.1 K/uL  Protime-INR     Status: None   Collection Time: 07/24/15  1:24 PM  Result Value Ref Range   Prothrombin Time 14.6 11.6 - 15.2 seconds   INR 1.12 0.00 - 1.49  Type and screen [facility]     Status: None   Collection Time: 07/24/15  1:24 PM  Result Value Ref Range   ABO/RH(D) O POS    Antibody Screen NEG    Sample Expiration 07/27/2015   Urinalysis, Routine w reflex microscopic (not at [facility])     Status: Abnormal   Collection Time: 07/24/15  1:27 PM  Result Value Ref Range   Color, Urine YELLOW YELLOW   APPearance HAZY (A) CLEAR   Specific Gravity, Urine 1.020 1.005 - 1.030   pH 7.0 5.0 - 8.0   Glucose, UA 100 (A) NEGATIVE mg/dL   Hgb urine dipstick LARGE (A) NEGATIVE   Bilirubin Urine NEGATIVE NEGATIVE   Ketones, ur TRACE (A) NEGATIVE mg/dL   Protein, ur 100 (A) NEGATIVE mg/dL   Nitrite NEGATIVE NEGATIVE   Leukocytes, UA NEGATIVE NEGATIVE  Urine microscopic-add on     Status: Abnormal   Collection Time: 07/24/15  1:27 PM  Result Value Ref Range   Squamous Epithelial / LPF 0-5 (A) NONE SEEN   WBC, UA 0-5 0 - 5 WBC/hpf   RBC / HPF 6-30 0 - 5  RBC/hpf   Bacteria, UA FEW (A) NONE SEEN   Casts GRANULAR CAST (A) NEGATIVE  Glucose, capillary     Status: Abnormal   Collection Time: 07/24/15  5:40 PM  Result Value Ref Range   Glucose-Capillary 178 (H) 65 - 99 mg/dL   Dg Chest 1 View  07/24/2015  CLINICAL DATA:  Left hip fracture.  Preop respiratory exam. EXAM: CHEST 1 VIEW COMPARISON:  12/22/2009 FINDINGS: Low lung volumes are noted. Heart size is within normal limits. No evidence pulmonary infiltrate, pleural effusion, or pneumothorax. IMPRESSION: Low lung volumes.  No active disease. Electronically Signed   By: Earle Gell M.D.   On: 07/24/2015 14:14   Dg Hip Unilat With Pelvis 2-3 Views Left  07/24/2015  CLINICAL DATA:  Pain following fall after tripping over a curb EXAM: DG HIP (WITH OR WITHOUT PELVIS) 2-3V LEFT COMPARISON:  None. FINDINGS: Frontal pelvis as well as frontal and lateral left hip images were obtained. There is a comminuted intertrochanteric femur fracture with varus angulation at the fracture site. There is avulsion of the lesser trochanter. No other fractures. No dislocations. There is symmetric narrowing of both hip joints. There is degenerative change in the lower lumbar region. IMPRESSION: Comminuted fracture, intertrochanteric left femur region with varus angulation at the fracture site and avulsion of the lesser trochanter. No other fractures. No dislocation. Moderate narrowing both hip joints. Electronically Signed   By: Lowella Grip III M.D.   On: 07/24/2015 14:15    ROS  No recent f/c/n/v/wt loss  Blood pressure 158/71, pulse 97, temperature 99.3 F (37.4 C), temperature source Oral, resp. rate 18, height 5' 9"  (1.753 m), weight 92.987 kg (205 lb), SpO2 97 %. Physical Exam  wn wd elderly male in nad.  Alert.  Oriented to person only though he knows that he's in a hospital.  HOH.  EOMI.  resp unlabored.  L LE shortened and externally rotated.  Skin healthy and intact.  Sens to LT intact at the L LE.  2+ dp and  pt pulses.  Painwith IR and ER of the L LE.  5/5 strengthin PF and DF of the ankel and toes.  Assessment/Plan L hip intertroch fracture - to OR for L hip open treatment with intramedullary nailing.  I believe it's medically necessary to treat this unstable left hip fracture surgically to allow the patient to resume ambulation as quickly as possible.  I explained the risks and benefits of the treatment alternatives to the patient's step daughter in detail.  The patient is unable to consent to surgical treatment, and his step daughter is unable to give consent.  Since there is no other family member available to consent, I believe it's medically necessary to use emergency consent to provide the necessary treatment.  I explained this to his step daughter who agrees with the plan.  I've discussed the plan with Dr. Tobias Alexander who agrees as well.  Wylene Simmer 07/24/2015, 7:11 PM

## 2015-07-24 NOTE — ED Notes (Signed)
[facility] RN Louie Bun and EMT went in to see pt at approx 1610 left room and went back in a few minutes later and pt was on the floor trying to get back in room. IV was pulled out. Pt awake and alert no new injuries noted at this time Dr. Sabra Heck made aware. Pulse intact to LLE. Pt placed back in pt, foley bag exchanged by [facility]. New IV started

## 2015-07-24 NOTE — Transfer of Care (Signed)
Immediate Anesthesia Transfer of Care Note  Patient: Bradley Roman  Procedure(s) Performed: Procedure(s): INTRAMEDULLARY (IM) NAIL INTERTROCHANTRIC AFFIXUS NAIL (Left)  Patient Location: PACU  Anesthesia Type:General  Level of Consciousness: sedated, patient cooperative and responds to stimulation  Airway & Oxygen Therapy: Patient Spontanous Breathing and Patient connected to nasal cannula oxygen  Post-op Assessment: Report given to RN, Post -op Vital signs reviewed and stable and Patient moving all extremities X 4  Post vital signs: Reviewed and stable  Last Vitals:  Filed Vitals:   07/24/15 1602 07/24/15 1730  BP: 114/63 158/71  Pulse: 82 97  Temp: 36.8 C 37.4 C  Resp: 15 18    Complications: No apparent anesthesia complications

## 2015-07-24 NOTE — ED Notes (Signed)
[facility] arrived to transport pt. Pt under care of [facility] team at this time, report given to team.

## 2015-07-25 DIAGNOSIS — S72142D Displaced intertrochanteric fracture of left femur, subsequent encounter for closed fracture with routine healing: Secondary | ICD-10-CM

## 2015-07-25 DIAGNOSIS — E669 Obesity, unspecified: Secondary | ICD-10-CM

## 2015-07-25 LAB — BASIC METABOLIC PANEL
Anion gap: 7 (ref 5–15)
BUN: 12 mg/dL (ref 6–20)
CHLORIDE: 108 mmol/L (ref 101–111)
CO2: 26 mmol/L (ref 22–32)
Calcium: 8.3 mg/dL — ABNORMAL LOW (ref 8.9–10.3)
Creatinine, Ser: 1.17 mg/dL (ref 0.61–1.24)
GFR calc non Af Amer: 59 mL/min — ABNORMAL LOW (ref >60)
Glucose, Bld: 159 mg/dL — ABNORMAL HIGH (ref 65–99)
POTASSIUM: 3.7 mmol/L (ref 3.5–5.1)
SODIUM: 141 mmol/L (ref 135–145)

## 2015-07-25 LAB — CBC
HEMATOCRIT: 31.1 % — AB (ref 39.0–52.0)
Hemoglobin: 10.2 g/dL — ABNORMAL LOW (ref 13.0–17.0)
MCH: 32.8 pg (ref 26.0–34.0)
MCHC: 32.8 g/dL (ref 30.0–36.0)
MCV: 100 fL (ref 78.0–100.0)
Platelets: 177 10*3/uL (ref 150–400)
RBC: 3.11 MIL/uL — AB (ref 4.22–5.81)
RDW: 13.5 % (ref 11.5–15.5)
WBC: 11.1 10*3/uL — AB (ref 4.0–10.5)

## 2015-07-25 LAB — GLUCOSE, CAPILLARY
GLUCOSE-CAPILLARY: 116 mg/dL — AB (ref 65–99)
GLUCOSE-CAPILLARY: 102 mg/dL — AB (ref 65–99)
Glucose-Capillary: 131 mg/dL — ABNORMAL HIGH (ref 65–99)
Glucose-Capillary: 105 mg/dL — ABNORMAL HIGH (ref 65–99)

## 2015-07-25 NOTE — Progress Notes (Signed)
PROGRESS NOTE  Bradley Roman [id number] DOB: 1940-09-28 DOA: 07/24/2015 PCP: Lanette Hampshire, MD  HPI/Recap of past 24 hours: Patient is a 75 year old male with past history of dementia and obesity who sustained a mechanical fall stepping off a curb landing on his left side on 1/14. In the emergency room and he was brought in, noted to have externally rotated left leg and found to have left-sided closed intertrochanteric fracture. Patient initially seen at [facility], which did not have orthopedic coverage since weekend and so patient transferred to [facility].patient taken to surgery that evening and underwent IM nail without issue.  Postop day 1 today, patient doing well. No complaints. Pleasantly confused. States his bowels are moving  Assessment/Plan: Active Problems:    Hyperglycemia: Noted elevated blood sugars, above 200. No previous history of diabetes. Covering with sliding scale and checking A1c.    Dementia: Stable. No behavioral disturbance.continued on Aricept.    Closed intertrochanteric fracture of left femur [facility]): Status post IM nail. Stable. For skilled nursing and neck few days   Obesity (BMI 30-39.9): Patient meets criteria with BMI greater than 30   Code Status: full code   Family Communication: left message with family   Disposition Plan: skilled nursing in the next few days    Consultants:  Orthopedic surgery   Procedures:  Status post IM nail   Antibiotics:  none    Objective: BP 118/62 mmHg  Pulse 54  Temp(Src) 98.4 F (36.9 C) (Oral)  Resp 18  Ht 5\' 9"  (1.753 m)  Wt 92.987 kg (205 lb)  BMI 30.26 kg/m2  SpO2 79%  Intake/Output Summary (Last 24 hours) at 07/25/15 1626 Last data filed at 07/25/15 0700  Gross per 24 hour  Intake   1300 ml  Output    600 ml  Net    700 ml   Filed Weights   07/24/15 1306  Weight: 92.987 kg (205 lb)    Exam:   General:  Alert and oriented 1   Cardiovascular: regular rate and  rhythm, S1-S2   Respiratory: clear to auscultation bilaterally   Abdomen: soft, obese, nontender, distended, hypoactive bowel sounds   Musculoskeletal: no clubbing or cyanosis, trace edema    Data Reviewed: Basic Metabolic Panel:  Recent Labs Lab 07/24/15 1324 07/24/15 2305 07/25/15 0342  NA 142  --  141  K 3.6  --  3.7  CL 107  --  108  CO2 27  --  26  GLUCOSE 211*  --  159*  BUN 14  --  12  CREATININE 1.06 1.17 1.17  CALCIUM 8.6*  --  8.3*   Liver Function Tests: No results for input(s): AST, ALT, ALKPHOS, BILITOT, PROT, ALBUMIN in the last 168 hours. No results for input(s): LIPASE, AMYLASE in the last 168 hours. No results for input(s): AMMONIA in the last 168 hours. CBC:  Recent Labs Lab 07/24/15 1324 07/24/15 2305 07/25/15 0342  WBC 11.0* 13.5* 11.1*  NEUTROABS 8.2*  --   --   HGB 11.4* 11.0* 10.2*  HCT 34.0* 31.0* 31.1*  MCV 99.1 99.4 100.0  PLT 206 331 177   Cardiac Enzymes:   No results for input(s): CKTOTAL, CKMB, CKMBINDEX, TROPONINI in the last 168 hours. BNP (last 3 results) No results for input(s): BNP in the last 8760 hours.  ProBNP (last 3 results) No results for input(s): PROBNP in the last 8760 hours.  CBG:  Recent Labs Lab 07/24/15 1740 07/24/15 2159 07/25/15 1007 07/25/15 1158  GLUCAP 178* 117* 131* 116*    No results found for this or any previous visit (from the past 240 hour(s)).   Studies: Dg Hip Operative Unilat With Pelvis Left  07/24/2015  CLINICAL DATA:  LEFT hip fracture, IM nail EXAM: OPERATIVE LEFT HIP (WITH PELVIS IF PERFORMED) 4 VIEWS TECHNIQUE: Fluoroscopic spot image(s) were submitted for interpretation post-operatively. COMPARISON:  07/24/2015 FLUOROSCOPY TIME:  1 minute 12 seconds Images obtained: 4 FINDINGS: Four digital C-arm fluoroscopic images obtained intraoperatively demonstrate placement of an IM nail with compression screw at the proximal LEFT femur post ORIF of an intertrochanteric fracture. Displaced  lesser trochanter fracture fragment again seen. No dislocation. Bones appear demineralized. IMPRESSION: Post ORIF of the previously identified intertrochanteric fracture LEFT femur. Electronically Signed   By: Lavonia Dana M.D.   On: 07/24/2015 22:10    Scheduled Meds: . donepezil  10 mg Oral QHS  . enoxaparin (LOVENOX) injection  40 mg Subcutaneous Q24H  . insulin aspart  0-15 Units Subcutaneous TID WC  . insulin aspart  0-5 Units Subcutaneous QHS    Continuous Infusions: . sodium chloride 75 mL/hr at 07/25/15 0000     Time spent: 15 minutes   [facility] Pager [phone] . If 7PM-7AM, please contact night-coverage at [email or web address], password [facility] 07/25/2015, 4:26 PM  LOS: 1 day

## 2015-07-25 NOTE — Evaluation (Signed)
Occupational Therapy Evaluation Patient Details Name: Bradley Roman MRN: [id number] DOB: 18-Jun-1941 Today's Date: 07/25/2015    History of Present Illness  Admitted post fall resulting in L hip fx, now s/p ORIF L hip with IM Nail; has a past medical history of Cancer ([place]) and Dementia.   Clinical Impression   Pt pleasantly confused throughout session; no family present to determine home setup information or PLOF. Currently pt is mod assist +2 for safety with functional mobility and mod assist for LB ADLs. Recommending SNF for follow up to maximize independence and safety with ADLs and functional mobility. Pt would benefit from continued skilled OT in order to increase independence and safety with LB ADLs, toilet transfers, and standing grooming activities.    Follow Up Recommendations  SNF;Supervision/Assistance - 24 hour    Equipment Recommendations  Other (comment) (TBD)    Recommendations for Other Services       Precautions / Restrictions Precautions Precautions: Fall Restrictions Weight Bearing Restrictions: Yes LLE Weight Bearing: Weight bearing as tolerated      Mobility Bed Mobility Overal bed mobility: Needs Assistance Bed Mobility: Supine to Sit     Supine to sit: Mod assist     General bed mobility comments: Verbal and tactile cues for technique and initiation; Assist to help LLE to EOB and elevate trunk to sitting  Transfers Overall transfer level: Needs assistance Equipment used: Rolling walker (2 wheeled) Transfers: Sit to/from Stand Sit to Stand: Mod assist;+2 safety/equipment         General transfer comment: Mod assist to power up; max cues including some hand-over- hand correction for hand placement and safety    Balance Overall balance assessment: Needs assistance Sitting-balance support: Bilateral upper extremity supported Sitting balance-Leahy Scale: Fair     Standing balance support: Bilateral upper extremity supported Standing  balance-Leahy Scale: Poor                              ADL Overall ADL's : Needs assistance/impaired Eating/Feeding: Set up;Sitting   Grooming: Set up;Sitting       Lower Body Bathing: Moderate assistance;Sit to/from stand       Lower Body Dressing: Moderate assistance;Sit to/from stand   Toilet Transfer: Moderate assistance;Ambulation;BSC;RW;+2 for safety/equipment (BSC over toilet) Toilet Transfer Details (indicate cue type and reason): simulated by transfer from EOB to chair Toileting- Clothing Manipulation and Hygiene: Minimal assistance;Sit to/from stand       Functional mobility during ADLs: Moderate assistance;+2 for safety/equipment;Rolling walker General ADL Comments: No family present for OT eval. Pt pleasntly confused throughout session; difficult to obtain home setup or PLOF.     Vision     Perception     Praxis      Pertinent Vitals/Pain Pain Assessment: Faces Faces Pain Scale: Hurts even more Pain Location: L hip and thigh Pain Descriptors / Indicators: Grimacing (and states "my leg hurts") Pain Intervention(s): Limited activity within patient's tolerance;Monitored during session;Repositioned;Ice applied     Hand Dominance     Extremity/Trunk Assessment Upper Extremity Assessment Upper Extremity Assessment: Generalized weakness   Lower Extremity Assessment Lower Extremity Assessment: Defer to PT evaluation       Communication Communication Communication: HOH (seems HOH)   Cognition Arousal/Alertness: Awake/alert Behavior During Therapy: WFL for tasks assessed/performed Overall Cognitive Status: No family/caregiver present to determine baseline cognitive functioning Area of Impairment: Orientation;Memory Orientation Level: Disoriented to;Place;Time;Situation   Memory: Decreased short-term memory  General Comments: Unable to state date, place; Able to state his birthday   General Comments       Exercises        Shoulder Instructions      Home Living Family/patient expects to be discharged to:: Unsure (pt with dementia, and family difficult to reach)                                 Additional Comments: Not very much information about prior level of function and home situation is available at this time as pt is pleasantly confused, and unable to give reliable information      Prior Functioning/Environment          Comments: Not very much information about prior level of function and home situation is available at this time as pt is pleasantly confused, and unable to give reliable information    OT Diagnosis: Generalized weakness;Cognitive deficits;Acute pain   OT Problem List: Decreased strength;Decreased activity tolerance;Impaired balance (sitting and/or standing);Decreased cognition;Decreased safety awareness;Decreased knowledge of use of DME or AE;Decreased knowledge of precautions;Pain   OT Treatment/Interventions: Self-care/ADL training;Energy conservation;DME and/or AE instruction;Therapeutic activities;Patient/family education    OT Goals(Current goals can be found in the care plan section) Acute Rehab OT Goals Patient Stated Goal: did not state, but seemed happy to get OOB OT Goal Formulation: With patient Time For Goal Achievement: 08/08/15 Potential to Achieve Goals: Good ADL Goals Pt Will Perform Grooming: with supervision;standing Pt Will Perform Lower Body Bathing: with supervision;sit to/from stand Pt Will Perform Lower Body Dressing: with supervision;sit to/from stand Pt Will Transfer to Toilet: with supervision;ambulating;bedside commode (over toilet) Pt Will Perform Toileting - Clothing Manipulation and hygiene: with supervision;sit to/from stand  OT Frequency: Min 2X/week   Barriers to D/C:            Co-evaluation PT/OT/SLP Co-Evaluation/Treatment: Yes Reason for Co-Treatment: For patient/therapist safety PT goals addressed during session:  Mobility/safety with mobility;Proper use of DME OT goals addressed during session: ADL's and self-care      End of Session Equipment Utilized During Treatment: Gait belt;Rolling walker;Oxygen (2L O2 throughout session)  Activity Tolerance: Patient tolerated treatment well Patient left: in chair;with call bell/phone within reach;with chair alarm set   Time: [id number] OT Time Calculation (min): 18 min Charges:  OT General Charges $OT Visit: 1 Procedure OT Evaluation $OT Eval Moderate Complexity: 1 Procedure G-Codes:     Binnie Kand M.S., OTR/L Pager: [phone]  07/25/2015, 1:26 PM

## 2015-07-25 NOTE — Evaluation (Signed)
Physical Therapy Evaluation Patient Details Name: Bradley Roman MRN: [id number] DOB: January 18, 1941 Today's Date: 07/25/2015   History of Present Illness   Admitted post fall resulting in L hip fx, now s/p ORIF L hip with IM Nail; has a past medical history of Cancer (Bradley Roman) and Dementia.  Clinical Impression  Patient is s/p above surgery resulting in functional limitations due to the deficits listed below (see PT Problem List).  Patient will benefit from skilled PT to increase their independence and safety with mobility to allow discharge to the venue listed below.        Follow Up Recommendations SNF (it may be time to look into an ALF living sutation post SNF for rehab)    Equipment Recommendations  Rolling walker with 5" wheels;3in1 (PT)    Recommendations for Other Services       Precautions / Restrictions Precautions Precautions: Fall Restrictions Weight Bearing Restrictions: Yes LLE Weight Bearing: Weight bearing as tolerated      Mobility  Bed Mobility Overal bed mobility: Needs Assistance Bed Mobility: Supine to Sit     Supine to sit: Mod assist     General bed mobility comments: Verbal and tactile cues for technique and initiation; Assist to help LLE to EOB and elevate trunk to sitting  Transfers Overall transfer level: Needs assistance Equipment used: Rolling walker (2 wheeled) Transfers: Sit to/from Stand Sit to Stand: Mod assist;+2 safety/equipment         General transfer comment: Mod assist to power up; max cues including some hand-over- hand correction for hand placement and safety  Ambulation/Gait Ambulation/Gait assistance: +2 safety/equipment;Mod assist Ambulation Distance (Feet): 5 Feet Assistive device: Rolling walker (2 wheeled) Gait Pattern/deviations: Step-through pattern;Decreased stance time - left;Decreased step length - right     General Gait Details: Cues for gait sequence and posture; Unsteady with numerous losses of balance  posteriorly requirirng at least mod assist to prevent fall  Stairs            Wheelchair Mobility    Modified Rankin (Stroke Patients Only)       Balance Overall balance assessment: Needs assistance Sitting-balance support: Bilateral upper extremity supported Sitting balance-Bradley Roman Scale: Fair     Standing balance support: Bilateral upper extremity supported Standing balance-Bradley Roman Scale: Poor                               Pertinent Vitals/Pain Pain Assessment: Faces Faces Pain Scale: Hurts even more Pain Location: L hip and thigh; face is calm and not grimacing when not moving; once we begin to move, Bradley Roman is able to state, "my leg hurts"; he returns to smiling once done moving Pain Descriptors / Indicators: Grimacing (and statements) Pain Intervention(s): Limited activity within patient's tolerance;Monitored during session;Repositioned    Home Living Family/patient expects to be discharged to:: Unsure (pt with dementia, and family difficult to reach)                 Additional Comments: Not very much information about prior level of function and home situation is available at this time as pt is pleasantly confused, and unable to give reliable information    Prior Function           Comments: Not very much information about prior level of function and home situation is available at this time as pt is pleasantly confused, and unable to give reliable information     Hand Dominance  Extremity/Trunk Assessment   Upper Extremity Assessment: Defer to OT evaluation           Lower Extremity Assessment: LLE deficits/detail   LLE Deficits / Details: Decr AROM and strength, limited by pain postop     Communication   Communication: HOH (seems HOH)  Cognition Arousal/Alertness: Awake/alert Behavior During Therapy: WFL for tasks assessed/performed Overall Cognitive Status: No family/caregiver present to determine baseline  cognitive functioning Area of Impairment: Orientation;Memory Orientation Level: Disoriented to;Place;Time;Situation   Memory: Decreased short-term memory         General Comments: Unable to state date, place; Able to state his birthday    General Comments General comments (skin integrity, edema, etc.): Session conducted on 2 L O2 via nasal cannula    Exercises        Assessment/Plan    PT Assessment Patient needs continued PT services  PT Diagnosis Difficulty walking;Acute pain   PT Problem List Decreased strength;Decreased range of motion;Decreased activity tolerance;Decreased balance;Decreased mobility;Decreased knowledge of use of DME;Decreased cognition;Decreased safety awareness;Decreased knowledge of precautions;Pain  PT Treatment Interventions DME instruction;Gait training;Functional mobility training;Therapeutic activities;Patient/family education;Therapeutic exercise;Balance training   PT Goals (Current goals can be found in the Care Plan section) Acute Rehab PT Goals Patient Stated Goal: did not state, but seemed happy to get OOB PT Goal Formulation: Patient unable to participate in goal setting Time For Goal Achievement: 08/08/15 Potential to Achieve Goals: Good    Frequency Min 3X/week   Barriers to discharge   Would like more information re: PLOF, or home situation    Co-evaluation PT/OT/SLP Co-Evaluation/Treatment: Yes Reason for Co-Treatment: For patient/therapist safety PT goals addressed during session: Mobility/safety with mobility;Proper use of DME         End of Session Equipment Utilized During Treatment: Gait belt Activity Tolerance: Patient tolerated treatment well Patient left: in chair;with call bell/phone within reach;with chair alarm set Nurse Communication: Mobility status         Time: [id number] PT Time Calculation (min) (ACUTE ONLY): 18 min   Charges:   PT Evaluation $PT Eval Moderate Complexity: 1 Procedure     PT G CodesQuin Hoop 07/25/2015, 10:59 AM  Roney Marion, PT  Acute Rehabilitation Services Pager [phone] Office [phone]

## 2015-07-25 NOTE — Op Note (Signed)
NAME:  Bradley Roman, Bradley Roman NO.:  [id number]  MEDICAL RECORD NO.:  [id number]  LOCATION:  5N05C                        FACILITY:  [facility]  PHYSICIAN:  Wylene Simmer, MD        DATE OF BIRTH:  01/12/1941  DATE OF PROCEDURE:  07/24/2015 DATE OF DISCHARGE:                              OPERATIVE REPORT   PREOPERATIVE DIAGNOSIS:  Left hip intertrochanteric fracture.  POSTOPERATIVE DIAGNOSIS:  Left hip intertrochanteric fracture.  PROCEDURE:  Open treatment of left hip intertrochanteric fracture with intramedullary nailing.  SURGEON:  Wylene Simmer, MD.  ANESTHESIA:  General.  ESTIMATED BLOOD LOSS:  100 mL.  TOURNIQUET TIME:  Zero.  COMPLICATIONS:  None apparent.  DISPOSITION:  Extubated, awake, and stable to recovery.  INDICATIONS FOR PROCEDURE:  The patient is a 75 year old male with past medical history significant for dementia and prostate cancer.  He fell earlier today injuring his left hip.  He was transferred down from [facility] since they did not have orthopedic coverage.  He was unable to give consent for this procedure due to his dementia.  He lives with his stepdaughter.  There is no power of attorney for his care.  The stepdaughter was unable to give verbal consent by phone.  She is out of town and there are no other blood relatives who can be contacted at this time.  As a result, emergency consent was obtained by myself and Dr. Tobias Alexander.  The patient presents now for intramedullary nailing of the left hip.  I have discussed the risks and benefits of the alternative treatment options with the patient's stepdaughter in detail.  This specifically included risks of bleeding, infection, nerve damage, blood clots, need for additional surgery, continued pain, nonunion, amputation, and death.  PROCEDURE IN DETAIL:  After preoperative consent was obtained and the correct operative site was identified, the patient was brought to the operating room and  placed supine on the fracture table.  General anesthesia was induced.  Preoperative antibiotics were administered. Surgical time-out was taken.  Left lower extremity was prepped and draped in standard sterile fashion using a shower curtain.  A reduction maneuver had previously been performed with abduction, traction, and external rotation followed by adduction and internal rotation.  AP and lateral radiographs showed appropriate reduction of the fracture prior to prepping and draping.  At this point, a longitudinal incision was made from the tip of the greater trochanter proximally.  Sharp dissection was carried down through skin and subcutaneous tissue.  The IT band and gluteal fascia were incised longitudinally.  The tip of the greater trochanter was palpated and a guidepin was inserted and advanced down the medullary canal.  AP and lateral radiographs confirmed appropriate position of the guidepin.  The starter reamer was then inserted and advanced into the medullary canal.  The starter reamer and guidewire were removed and a short Biomet Affixus 11-mm nail was advanced down into the medullary canal.  It was seated appropriately.  The targeting guide was then used to insert an interlocking lag screw into the femoral head at the center- center point.  AP and lateral radiographs confirmed appropriate position of the lag screw.  Lag  screw was then compressed and secured to the nail in its sliding position.  Targeting guide was then used to insert an interlocking screw at the distal static hole of the nail.  Final AP and lateral radiographs confirmed appropriate position and length of all hardware and appropriate reduction of the fracture.  Wounds were irrigated copiously.  Vicryl was used to close the gluteal fascia. Subcutaneous tissues were approximated with Monocryl, and skin incisions were closed with staples.  Sterile dressings were applied.  The patient was awakened from anesthesia  and transported to recovery room in stable condition.  FOLLOWUP PLAN:  The patient will be admitted to the Hospitalist Service. He will be weightbearing as tolerated on the left lower extremity and start physical therapy and occupational therapy.  He will start Lovenox for DVT prophylaxis tomorrow.     Wylene Simmer, MD     JH/MEDQ  D:  07/24/2015  T:  07/25/2015  Job:  [id number]

## 2015-07-25 NOTE — Progress Notes (Signed)
    Subjective: 1 Day Post-Op Procedure(s) (LRB): INTRAMEDULLARY (IM) NAIL INTERTROCHANTRIC AFFIXUS NAIL (Left) Patient reports pain as 3 on 0-10 scale.   Denies CP or SOB.  Voiding without difficulty. Positive flatus. Objective: Vital signs in last 24 hours: Temp:  [97.9 F (36.6 C)-99.3 F (37.4 C)] 99.3 F (37.4 C) (01/15 0520) Pulse Rate:  [82-103] 90 (01/15 0520) Resp:  [10-25] 18 (01/15 0520) BP: (114-173)/(63-91) 132/65 mmHg (01/15 0520) SpO2:  [95 %-100 %] 99 % (01/15 0520) Weight:  [92.987 kg (205 lb)] 92.987 kg (205 lb) (01/14 1306)  Intake/Output from previous day: 01/14 0701 - 01/15 0700 In: 1100 [I.V.:1100] Out: 600 [Urine:400; Blood:200] Intake/Output this shift: Total I/O In: 1100 [I.V.:1100] Out: 600 [Urine:400; Blood:200]  Labs:  Recent Labs  07/24/15 1324 07/24/15 2305 07/25/15 0342  HGB 11.4* 11.0* 10.2*    Recent Labs  07/24/15 2305 07/25/15 0342  WBC 13.5* 11.1*  RBC 3.12* 3.11*  HCT 31.0* 31.1*  PLT 331 177    Recent Labs  07/24/15 1324 07/24/15 2305 07/25/15 0342  NA 142  --  141  K 3.6  --  3.7  CL 107  --  108  CO2 27  --  26  BUN 14  --  12  CREATININE 1.06 1.17 1.17  GLUCOSE 211*  --  159*  CALCIUM 8.6*  --  8.3*    Recent Labs  07/24/15 1324  INR 1.12    Physical Exam: Neurologically intact ABD soft Intact pulses distally Incision: dressing C/D/I Compartment soft  Assessment/Plan: 1 Day Post-Op Procedure(s) (LRB): INTRAMEDULLARY (IM) NAIL INTERTROCHANTRIC AFFIXUS NAIL (Left) Advance diet Up with therapy  D/c plans per medicine  Valery Amedee D for Dr. Melina Schools [facility] [phone] 07/25/2015, 6:54 AM

## 2015-07-25 NOTE — Progress Notes (Signed)
Utilization review completed.  

## 2015-07-26 ENCOUNTER — Encounter (HOSPITAL_COMMUNITY): Payer: Self-pay | Admitting: Orthopedic Surgery

## 2015-07-26 DIAGNOSIS — D62 Acute posthemorrhagic anemia: Secondary | ICD-10-CM | POA: Diagnosis not present

## 2015-07-26 DIAGNOSIS — F0390 Unspecified dementia without behavioral disturbance: Secondary | ICD-10-CM

## 2015-07-26 DIAGNOSIS — R739 Hyperglycemia, unspecified: Secondary | ICD-10-CM

## 2015-07-26 LAB — GLUCOSE, CAPILLARY
GLUCOSE-CAPILLARY: 112 mg/dL — AB (ref 65–99)
GLUCOSE-CAPILLARY: 111 mg/dL — AB (ref 65–99)
Glucose-Capillary: 94 mg/dL (ref 65–99)
Glucose-Capillary: 115 mg/dL — ABNORMAL HIGH (ref 65–99)

## 2015-07-26 LAB — CBC
HEMATOCRIT: 27.2 % — AB (ref 39.0–52.0)
Hemoglobin: 8.8 g/dL — ABNORMAL LOW (ref 13.0–17.0)
MCH: 32.1 pg (ref 26.0–34.0)
MCHC: 32.4 g/dL (ref 30.0–36.0)
MCV: 99.3 fL (ref 78.0–100.0)
PLATELETS: 157 10*3/uL (ref 150–400)
RBC: 2.74 MIL/uL — AB (ref 4.22–5.81)
RDW: 13.6 % (ref 11.5–15.5)
WBC: 11.9 10*3/uL — AB (ref 4.0–10.5)

## 2015-07-26 LAB — HEMOGLOBIN A1C
HEMOGLOBIN A1C: 5.2 % (ref 4.8–5.6)
Mean Plasma Glucose: 103 mg/dL

## 2015-07-26 MED ORDER — SENNOSIDES-DOCUSATE SODIUM 8.6-50 MG PO TABS
2.0000 | ORAL_TABLET | Freq: Every day | ORAL | Status: DC
  Administered 2015-07-27: 2 via ORAL
  Filled 2015-07-26: qty 2

## 2015-07-26 NOTE — Clinical Social Work Note (Signed)
Clinical Social Work Assessment  Patient Details  Name: Bradley Roman MRN: [id number] Date of Birth: 07/01/1941  Date of referral:  07/26/15               Reason for consult:  Facility Placement                Permission sought to share information with:  Facility Sport and exercise psychologist, Family Supports Permission granted to share information::   (patient not oriented)  Name::      (Step Daughter, Herbalist)  Agency::   (SNFs in [place])  Relationship::     Contact Information:     Housing/Transportation Living arrangements for the past 2 months:  Single Family Home Source of Information:  Adult Children IT consultant) Patient Interpreter Needed:  None Criminal Activity/Legal Involvement Pertinent to Current Situation/Hospitalization:  No - Comment as needed Significant Relationships:  Adult Children Lives with:  Adult Children (step daughter and husband) Do you feel safe going back to the place where you live?    Need for family participation in patient care:  Yes (Comment) (patient not fully oriented)  Care giving concerns:  Patient's daughter Joseph Art was present at bedside during the course of this assessment. Renee states other than placement for STR there are no current care giving concerns.   Social Worker assessment / plan:  CSW received consult for SNF placement for patient.  Patient is alert though not oriented fully at this time.  Patient is from home with step-daughter and husband prior to this admission.  Joseph Art is agreeable for patient to receive STR at a SNF in [facility] at time of discharge.  Renee would like to check into [facility] for proximity sake.  CSW will complete SNF referral and inform family of bed offers once available. Employment status:  Retired Forensic scientist:  Production manager) PT Recommendations:  [place] / Referral to community resources:  [place]  Patient/Family's  Response to care:  Family is agreeable to SNF placement.  Patient/Family's Understanding of and Emotional Response to Diagnosis, Current Treatment, and Prognosis:  Family is realistic regarding needed therapies at time of discharge.  Emotional Assessment Appearance:  Appears older than stated age Attitude/Demeanor/Rapport:   (appropriate- dementia) Affect (typically observed):  Unable to Assess Orientation:  Oriented to Self Alcohol / Substance use:  Not Applicable Psych involvement (Current and /or in the community):  No (Comment)  Discharge Needs  Concerns to be addressed:  No discharge needs identified Readmission within the last 30 days:  No Current discharge risk:  None Barriers to Discharge:  Continued Medical Work up   [employer], LCSW 07/26/2015, 1:39 PM

## 2015-07-26 NOTE — Progress Notes (Signed)
Subjective: 2 Days Post-Op Procedure(s) (LRB): INTRAMEDULLARY (IM) NAIL INTERTROCHANTRIC AFFIXUS NAIL (Left)  Patient reports pain as mild to moderate.  Patient eating lunch and in good spirits.  Admits to BM.  Denies fever, chills, N/V.  Objective:   VITALS:  Temp:  [98.3 F (36.8 C)-98.9 F (37.2 C)] 98.3 F (36.8 C) (01/16 0600) Pulse Rate:  [54-93] 87 (01/16 0600) Resp:  [18] 18 (01/16 0600) BP: (117-144)/(47-62) 144/54 mmHg (01/16 0600) SpO2:  [79 %-100 %] 100 % (01/16 0600)  General: WDWN patient in NAD. Psych:  Appropriate mood and affect. Neuro:  A&O x 3, Moving all extremities, sensation intact to light touch HEENT:  EOMs intact Chest:  Even non-labored respirations Skin:  Dressing C/D/I, no rashes or lesions Extremities: warm/dry, mild edema, no erythema or echymosis.  No lymphadenopathy. Pulses: popliteus 2+ MSK:  ROM: Full Ankle ROM compared bilaterally, MMT: patient can perform quad set, (-) Homan's    LABS  Recent Labs  07/24/15 1324 07/24/15 2305 07/25/15 0342 07/26/15 0544  HGB 11.4* 11.0* 10.2* 8.8*  WBC 11.0* 13.5* 11.1* 11.9*  PLT 206 331 177 157    Recent Labs  07/24/15 1324 07/24/15 2305 07/25/15 0342  NA 142  --  141  K 3.6  --  3.7  CL 107  --  108  CO2 27  --  26  BUN 14  --  12  CREATININE 1.06 1.17 1.17  GLUCOSE 211*  --  159*    Recent Labs  07/24/15 1324  INR 1.12     Assessment/Plan: 2 Days Post-Op Procedure(s) (LRB): INTRAMEDULLARY (IM) NAIL INTERTROCHANTRIC AFFIXUS NAIL (Left)  Up with therapy  WBAT LLE Plan for D/C to SNF when bed available. Lovenox for DVT prophylaxis Plan for 2 week post-op visit with Dr. Doran Durand in the office.  Mechele Claude, PA-C, [employer] Office:  [phone]

## 2015-07-26 NOTE — Progress Notes (Addendum)
PROGRESS NOTE  Bradley Roman [id number] DOB: 1941/02/06 DOA: 07/24/2015 PCP: Lanette Hampshire, MD  HPI/Recap of past 24 hours: Patient is a 75 year old male with past history of dementia and obesity who sustained a mechanical fall stepping off a curb landing on his left side on 1/14. In the emergency room and he was brought in, noted to have externally rotated left leg and found to have left-sided closed intertrochanteric fracture. Patient initially seen at [facility], which did not have orthopedic coverage since weekend and so patient transferred to [facility].patient taken to surgery that evening and underwent IM nail without issue.  Assessment/Plan: Active Problems:    Hyperglycemia: Noted elevated blood sugars, above 200. No previous history of diabetes. CBGs now much improved. May be stress response. hgb a1c still pending. D/c SSI    Dementia: Stable. Still with Air cabin crew. Cant go to SNF until without sitter for 24 hours. Will d/c and monitor carefully    Closed intertrochanteric fracture of left femur [facility]): Status post IM nail. Stable. For skilled nursing. lovenox   Obesity (BMI 30-39.9): Patient meets criteria with BMI greater than 30  Acute postoperative blood loss anemia, expected. Monitor. Transfuse if less than 7 hemoglobin.  Code Status: full code   Family Communication: left message with family   Disposition Plan: skilled nursing when without sitter x24 hours   Consultants:  Orthopedic surgery   Procedures:  Status post IM nail   Antibiotics:  none   subjective: no complaints. confused Objective: BP 144/54 mmHg  Pulse 87  Temp(Src) 98.3 F (36.8 C) (Oral)  Resp 18  Ht 5\' 9"  (1.753 m)  Wt 92.987 kg (205 lb)  BMI 30.26 kg/m2  SpO2 100%  Intake/Output Summary (Last 24 hours) at 07/26/15 0927 Last data filed at 07/26/15 0900  Gross per 24 hour  Intake   1710 ml  Output    600 ml  Net   1110 ml   Filed Weights   07/24/15 1306    Weight: 92.987 kg (205 lb)    Exam:   General:  Alert confused. Pleasant and cooperative  Cardiovascular: regular rate and rhythm, S1-S2   Respiratory: clear to auscultation bilaterally   Abdomen: soft, obese, nontender, distended, hypoactive bowel sounds   Musculoskeletal: dressing CDI. No CCE  Data Reviewed: Basic Metabolic Panel:  Recent Labs Lab 07/24/15 1324 07/24/15 2305 07/25/15 0342  NA 142  --  141  K 3.6  --  3.7  CL 107  --  108  CO2 27  --  26  GLUCOSE 211*  --  159*  BUN 14  --  12  CREATININE 1.06 1.17 1.17  CALCIUM 8.6*  --  8.3*   Liver Function Tests: No results for input(s): AST, ALT, ALKPHOS, BILITOT, PROT, ALBUMIN in the last 168 hours. No results for input(s): LIPASE, AMYLASE in the last 168 hours. No results for input(s): AMMONIA in the last 168 hours. CBC:  Recent Labs Lab 07/24/15 1324 07/24/15 2305 07/25/15 0342 07/26/15 0544  WBC 11.0* 13.5* 11.1* 11.9*  NEUTROABS 8.2*  --   --   --   HGB 11.4* 11.0* 10.2* 8.8*  HCT 34.0* 31.0* 31.1* 27.2*  MCV 99.1 99.4 100.0 99.3  PLT 206 331 177 157   Cardiac Enzymes:   No results for input(s): CKTOTAL, CKMB, CKMBINDEX, TROPONINI in the last 168 hours. BNP (last 3 results) No results for input(s): BNP in the last 8760 hours.  ProBNP (last 3 results) No results  for input(s): PROBNP in the last 8760 hours.  CBG:  Recent Labs Lab 07/25/15 1007 07/25/15 1158 07/25/15 1643 07/25/15 2240 07/26/15 0603  GLUCAP 131* 116* 105* 102* 94    No results found for this or any previous visit (from the past 240 hour(s)).   Studies: No results found.  Scheduled Meds: . donepezil  10 mg Oral QHS  . enoxaparin (LOVENOX) injection  40 mg Subcutaneous Q24H  . insulin aspart  0-15 Units Subcutaneous TID WC  . insulin aspart  0-5 Units Subcutaneous QHS    Continuous Infusions: . sodium chloride Stopped (07/25/15 1939)     Time spent: 15 minutes   [place]  Hospitalists [email or web address], password [facility] 07/26/2015, 9:27 AM  LOS: 2 days

## 2015-07-26 NOTE — NC FL2 (Signed)
[place] LEVEL OF CARE SCREENING TOOL     IDENTIFICATION  Patient Name: Bradley Roman Birthdate: 31-Dec-1940 Sex: male Admission Date (Current Location): 07/24/2015  [facility] and [place] Number:  [employer] and Address:  [employer] [facility], [place] [address] [place], [place] [address]      Provider Number: [id number]  Attending Physician Name and Address:  Delfina Redwood, MD  Relative Name and Phone Number:       Current Level of Care: Hospital Recommended Level of Care: [place] Prior Approval Number:    Date Approved/Denied:   PASRR Number: [id number]  Discharge Plan: SNF    Current Diagnoses: Patient Active Problem List   Diagnosis Date Noted  . Postoperative anemia due to acute blood loss 07/26/2015  . Obesity (BMI 30-39.9) 07/25/2015  . Hip fracture, left ([place]) 07/24/2015  . Hyperglycemia 07/24/2015  . Dementia 07/24/2015  . Closed intertrochanteric fracture of left femur ([place]) 07/24/2015    Orientation RESPIRATION BLADDER Height & Weight    Self, Place  Normal Continent 5\' 9"  (175.3 cm) 205 lbs.  BEHAVIORAL SYMPTOMS/MOOD NEUROLOGICAL BOWEL NUTRITION STATUS      Continent    AMBULATORY STATUS COMMUNICATION OF NEEDS Skin     Verbally Surgical wounds                       Personal Care Assistance Level of Assistance              Functional Limitations Info             SPECIAL CARE FACTORS FREQUENCY  PT (By licensed PT), OT (By licensed OT)                    Contractures Contractures Info: Not present    Additional Factors Info                  Current Medications (07/26/2015):  This is the current hospital active medication list Current Facility-Administered Medications  Medication Dose Route Frequency Provider Last Rate Last Dose  . acetaminophen (TYLENOL) tablet 650 mg  650 mg Oral Q6H PRN Wylene Simmer, MD   650 mg at 07/25/15 2003   Or  . acetaminophen  (TYLENOL) suppository 650 mg  650 mg Rectal Q6H PRN Wylene Simmer, MD      . alum & mag hydroxide-simeth (MAALOX/MYLANTA) 200-200-20 MG/5ML suspension 30 mL  30 mL Oral Q6H PRN Tanna Savoy Stinson, DO      . donepezil (ARICEPT) tablet 10 mg  10 mg Oral QHS Tanna Savoy Stinson, DO   10 mg at 07/25/15 2003  . enoxaparin (LOVENOX) injection 40 mg  40 mg Subcutaneous Q24H Wylene Simmer, MD   40 mg at 07/26/15 [id number]  . HYDROcodone-acetaminophen (NORCO/VICODIN) 5-325 MG per tablet 1 tablet  1 tablet Oral Q6H PRN Wylene Simmer, MD   1 tablet at 07/26/15 [phone]  . ondansetron (ZOFRAN) tablet 4 mg  4 mg Oral Q6H PRN Tanna Savoy Stinson, DO       Or  . ondansetron [facility]) injection 4 mg  4 mg Intravenous Q6H PRN Tanna Savoy Stinson, DO      . polyethylene glycol (MIRALAX / GLYCOLAX) packet 17 g  17 g Oral Daily PRN Tanna Savoy Stinson, DO      . senna-docusate (Senokot-S) tablet 2 tablet  2 tablet Oral Daily Delfina Redwood, MD   2 tablet at 07/26/15 1000  Discharge Medications: Please see discharge summary for a list of discharge medications.  Relevant Imaging Results:  Relevant Lab Results:   Additional Information SSN: [id number]  Dulcy Fanny, LCSW

## 2015-07-26 NOTE — Plan of Care (Signed)
Problem: Education: Goal: Knowledge of  General Education information/materials will improve Outcome: Not Progressing D/t coginitive status. Diagnosis of dementia.  Problem: Safety: Goal: Ability to remain free from injury will improve Outcome: Progressing HIGH fall risk. Unsteady with ambulation. Unsafe positioning and safety measures. No new injuries noted, no falls.

## 2015-07-26 NOTE — Clinical Social Work Placement (Signed)
   CLINICAL SOCIAL WORK PLACEMENT  NOTE  Date:  07/26/2015  Patient Details  Name: Bradley Roman MRN: [id number] Date of Birth: 1940-09-30  Clinical Social Work is seeking post-discharge placement for this patient at the [place] level of care (*CSW will initial, date and re-position this form in  chart as items are completed):  Yes   Patient/family provided with [place] Work Department's list of facilities offering this level of care within the geographic area requested by the patient (or if unable, by the patient's family).  Yes   Patient/family informed of their freedom to choose among providers that offer the needed level of care, that participate in Medicare, Medicaid or managed care program needed by the patient, have an available bed and are willing to accept the patient.  Yes   Patient/family informed of [facility]'s ownership interest in [facility] and [facility], as well as of the fact that they are under no obligation to receive care at these facilities.  PASRR submitted to EDS on 07/26/15     PASRR number received on 07/26/15     Existing PASRR number confirmed on       FL2 transmitted to all facilities in geographic area requested by pt/family on 07/26/15     FL2 transmitted to all facilities within larger geographic area on       Patient informed that his/her managed care company has contracts with or will negotiate with certain facilities, including the following:            Patient/family informed of bed offers received.  Patient chooses bed at       Physician recommends and patient chooses bed at      Patient to be transferred to   on  .  Patient to be transferred to facility by       Patient family notified on   of transfer.  Name of family member notified:        PHYSICIAN       Additional Comment:    _______________________________________________ Dulcy Fanny, LCSW 07/26/2015, 1:42 PM

## 2015-07-27 ENCOUNTER — Other Ambulatory Visit: Payer: Self-pay | Admitting: Urology

## 2015-07-27 DIAGNOSIS — C61 Malignant neoplasm of prostate: Secondary | ICD-10-CM

## 2015-07-27 DIAGNOSIS — D62 Acute posthemorrhagic anemia: Secondary | ICD-10-CM

## 2015-07-27 LAB — GLUCOSE, CAPILLARY
GLUCOSE-CAPILLARY: 104 mg/dL — AB (ref 65–99)
GLUCOSE-CAPILLARY: 99 mg/dL (ref 65–99)

## 2015-07-27 LAB — CBC
HEMATOCRIT: 23.7 % — AB (ref 39.0–52.0)
HEMOGLOBIN: 7.9 g/dL — AB (ref 13.0–17.0)
MCH: 33.1 pg (ref 26.0–34.0)
MCHC: 33.3 g/dL (ref 30.0–36.0)
MCV: 99.2 fL (ref 78.0–100.0)
Platelets: 154 10*3/uL (ref 150–400)
RBC: 2.39 MIL/uL — AB (ref 4.22–5.81)
RDW: 13.4 % (ref 11.5–15.5)
WBC: 10.4 10*3/uL (ref 4.0–10.5)

## 2015-07-27 MED ORDER — ENOXAPARIN SODIUM 40 MG/0.4ML ~~LOC~~ SOLN
40.0000 mg | SUBCUTANEOUS | Status: DC
Start: 2015-07-27 — End: 2022-06-24

## 2015-07-27 MED ORDER — ACETAMINOPHEN 325 MG PO TABS: 650.0000 mg | ORAL_TABLET | Freq: Four times a day (QID) | ORAL | Status: AC | PRN

## 2015-07-27 MED ORDER — SENNOSIDES-DOCUSATE SODIUM 8.6-50 MG PO TABS: 2.0000 | ORAL_TABLET | Freq: Every day | ORAL | Status: DC

## 2015-07-27 MED ORDER — HYDROCODONE-ACETAMINOPHEN 5-325 MG PO TABS: 1.0000 | ORAL_TABLET | Freq: Four times a day (QID) | ORAL | Status: DC | PRN

## 2015-07-27 MED ORDER — ENOXAPARIN SODIUM 40 MG/0.4ML ~~LOC~~ SOLN: 40.0000 mg | SUBCUTANEOUS | Status: DC

## 2015-07-27 NOTE — Discharge Summary (Signed)
Physician Discharge Summary  Bradley Roman [id number] DOB: Dec 20, 1940 DOA: 07/24/2015  PCP: Lanette Hampshire, MD  Admit date: 07/24/2015 Discharge date: 07/27/2015  Time spent: greater than 30 min  Recommendations for Outpatient Follow-up:  1. To SNF 2. Monitor h/h one week   Discharge Diagnoses:  Active Problems:   Hip fracture, left (HCC)   Hyperglycemia, without diabetes   Dementia   Closed intertrochanteric fracture of left femur (HCC)   Obesity (BMI 30-39.9)   Postoperative anemia due to acute blood loss   Discharge Condition: stable  Diet recommendation: general  Filed Weights   07/24/15 1306  Weight: 92.987 kg (205 lb)    History of present illness:  75 y.o. male  With a history of severe dementia. As such the patient is unable to provide details of history. History is provided by the medical record, nursing notes, ED note. Patient was walking and slipped striking his left hip and left elbow when he stepped down off the curb. He had acute onset of pain and had difficulty getting up off the ground. Found to have left intertrochanteric fracture  Hospital Course:  Admitted to hospitalist service. Orthopedics consulted. Patient underwent IM nail without complications. Did have some postoperative anemia which is expected, did not require transfusion. Has worked with physical therapy occupational therapy and cleared for discharge by orthopedics to skilled nursing facility. On lovenox postop for DVT prophylaxis, which may be continued at skilled nursing facility. Monitor H&H periodically. Will need follow-up with Dr. Doran Durand in 2 weeks.  Procedures: IM Nail, 07/25/15  Consultations:  Orthopedics, Wylene Simmer  Discharge Exam: Filed Vitals:   07/26/15 2209 07/27/15 0500  BP: 121/59 114/51  Pulse: 89 96  Temp: 99 F (37.2 C) 98.9 F (37.2 C)  Resp: 18 18    General: Alert, pleasantly confused. Cooperative. Appears comfortable.  Cardiovascular: Regular rate  rhythm without murmurs gallops rubs  Respiratory: Clear to auscultation bilaterally without wheezes rhonchi or rales Extremities: Dressing clean dry and intact. No edema  Discharge Instructions   Discharge Instructions    Diet general    Complete by:  As directed      Walk with assistance    Complete by:  As directed           Current Discharge Medication List    START taking these medications   Details  acetaminophen (TYLENOL) 325 MG tablet Take 2 tablets (650 mg total) by mouth every 6 (six) hours as needed for mild pain (or Fever >/= 101).    enoxaparin (LOVENOX) 40 MG/0.4ML injection Inject 0.4 mLs (40 mg total) into the skin daily. For 4 weeks Qty: 0 Syringe    HYDROcodone-acetaminophen (NORCO/VICODIN) 5-325 MG tablet Take 1 tablet by mouth every 6 (six) hours as needed for severe pain (breakthrough pain). Qty: 20 tablet, Refills: 0    senna-docusate (SENOKOT-S) 8.6-50 MG tablet Take 2 tablets by mouth daily.      CONTINUE these medications which have NOT CHANGED   Details  donepezil (ARICEPT) 10 MG tablet Take 1 tablet by mouth at bedtime. Refills: 4       No Known Allergies Follow-up Information    Follow up with Wylene Simmer, MD In 2 weeks.   Specialty:  Orthopedic Surgery   Contact information:   [address] [place] [place] [address] [phone]        The results of significant diagnostics from this hospitalization (including imaging, microbiology, ancillary and laboratory) are listed below for reference.  Significant Diagnostic Studies: Dg Chest 1 View  07/24/2015  CLINICAL DATA:  Left hip fracture.  Preop respiratory exam. EXAM: CHEST 1 VIEW COMPARISON:  12/22/2009 FINDINGS: Low lung volumes are noted. Heart size is within normal limits. No evidence pulmonary infiltrate, pleural effusion, or pneumothorax. IMPRESSION: Low lung volumes.  No active disease. Electronically Signed   By: Earle Gell M.D.   On: 07/24/2015 14:14   Dg Hip  Operative Unilat With Pelvis Left  07/24/2015  CLINICAL DATA:  LEFT hip fracture, IM nail EXAM: OPERATIVE LEFT HIP (WITH PELVIS IF PERFORMED) 4 VIEWS TECHNIQUE: Fluoroscopic spot image(s) were submitted for interpretation post-operatively. COMPARISON:  07/24/2015 FLUOROSCOPY TIME:  1 minute 12 seconds Images obtained: 4 FINDINGS: Four digital C-arm fluoroscopic images obtained intraoperatively demonstrate placement of an IM nail with compression screw at the proximal LEFT femur post ORIF of an intertrochanteric fracture. Displaced lesser trochanter fracture fragment again seen. No dislocation. Bones appear demineralized. IMPRESSION: Post ORIF of the previously identified intertrochanteric fracture LEFT femur. Electronically Signed   By: Lavonia Dana M.D.   On: 07/24/2015 22:10   Dg Hip Unilat With Pelvis 2-3 Views Left  07/24/2015  CLINICAL DATA:  Pain following fall after tripping over a curb EXAM: DG HIP (WITH OR WITHOUT PELVIS) 2-3V LEFT COMPARISON:  None. FINDINGS: Frontal pelvis as well as frontal and lateral left hip images were obtained. There is a comminuted intertrochanteric femur fracture with varus angulation at the fracture site. There is avulsion of the lesser trochanter. No other fractures. No dislocations. There is symmetric narrowing of both hip joints. There is degenerative change in the lower lumbar region. IMPRESSION: Comminuted fracture, intertrochanteric left femur region with varus angulation at the fracture site and avulsion of the lesser trochanter. No other fractures. No dislocation. Moderate narrowing both hip joints. Electronically Signed   By: Lowella Grip III M.D.   On: 07/24/2015 14:15    Microbiology: No results found for this or any previous visit (from the past 240 hour(s)).   Labs: Basic Metabolic Panel:  Recent Labs Lab 07/24/15 1324 07/24/15 2305 07/25/15 0342  NA 142  --  141  K 3.6  --  3.7  CL 107  --  108  CO2 27  --  26  GLUCOSE 211*  --  159*  BUN  14  --  12  CREATININE 1.06 1.17 1.17  CALCIUM 8.6*  --  8.3*   Liver Function Tests: No results for input(s): AST, ALT, ALKPHOS, BILITOT, PROT, ALBUMIN in the last 168 hours. No results for input(s): LIPASE, AMYLASE in the last 168 hours. No results for input(s): AMMONIA in the last 168 hours. CBC:  Recent Labs Lab 07/24/15 1324 07/24/15 2305 07/25/15 0342 07/26/15 0544 07/27/15 0655  WBC 11.0* 13.5* 11.1* 11.9* 10.4  NEUTROABS 8.2*  --   --   --   --   HGB 11.4* 11.0* 10.2* 8.8* 7.9*  HCT 34.0* 31.0* 31.1* 27.2* 23.7*  MCV 99.1 99.4 100.0 99.3 99.2  PLT 206 331 177 157 154   Cardiac Enzymes: No results for input(s): CKTOTAL, CKMB, CKMBINDEX, TROPONINI in the last 168 hours. BNP: BNP (last 3 results) No results for input(s): BNP in the last 8760 hours.  ProBNP (last 3 results) No results for input(s): PROBNP in the last 8760 hours.  CBG:  Recent Labs Lab 07/26/15 0603 07/26/15 1125 07/26/15 1550 07/26/15 2202 07/27/15 0610  GLUCAP 94 112* 115* 111* 104*       Signed:  Demarquez Ciolek L  MD [facility] 07/27/2015, 8:56 AM

## 2015-07-27 NOTE — Progress Notes (Signed)
Subjective: 3 Days Post-Op Procedure(s) (LRB): INTRAMEDULLARY (IM) NAIL INTERTROCHANTRIC AFFIXUS NAIL (Left)  Patient reports pain as mild to moderate.  Tolerating POs well.  Admits to BM.  Up working with therapy upon arrival.  Denies fever, chills, N/V.  Therapy noticed that his dressing was soaked with red blood.  Objective:   VITALS:  Temp:  [98.9 F (37.2 C)-100.3 F (37.9 C)] 98.9 F (37.2 C) (01/17 0500) Pulse Rate:  [89-97] 96 (01/17 0500) Resp:  [18] 18 (01/17 0500) BP: (114-124)/(51-59) 114/51 mmHg (01/17 0500) SpO2:  [95 %-99 %] 96 % (01/17 0500)  General: WDWN patient in NAD. Psych:  Appropriate mood and affect. Neuro:  A&O x 1, Moving all extremities, sensation intact to light touch HEENT:  EOMs intact Chest:  Even non-labored respirations Skin:  Dressing damp with red blood but intact.  Dressing removed, sutures are intact, no active bleeding, no purulence.  Dry dressing applied with new mepilex dressing, no rashes or lesions Extremities: warm/dry, no edema, erythmea, or echymosis.  No lymphadenopathy. Pulses: [place] 2+ MSK:  ROM: TKE to 100 KF, HF to 60 degrees, MMT: patient can perform quad set, (-) Homan's    LABS  Recent Labs  07/24/15 1324 07/24/15 2305 07/25/15 0342 07/26/15 0544 07/27/15 0655  HGB 11.4* 11.0* 10.2* 8.8* 7.9*  WBC 11.0* 13.5* 11.1* 11.9* 10.4  PLT 206 331 177 157 154    Recent Labs  07/24/15 1324 07/24/15 2305 07/25/15 0342  NA 142  --  141  K 3.6  --  3.7  CL 107  --  108  CO2 27  --  26  BUN 14  --  12  CREATININE 1.06 1.17 1.17  GLUCOSE 211*  --  159*    Recent Labs  07/24/15 1324  INR 1.12     Assessment/Plan: 3 Days Post-Op Procedure(s) (LRB): INTRAMEDULLARY (IM) NAIL INTERTROCHANTRIC AFFIXUS NAIL (Left)  Up with therapy  Plan for discharge to SNF when bed available [facility] LLE Plan for 2 week outpatient post-op visit with Dr. Doran Durand.  Mechele Claude, PA-C, [employer] Office:   [phone]

## 2015-07-27 NOTE — Clinical Social Work Note (Signed)
Patient will discharge today per MD order. Patient will discharge to [facility] SNF RN to call report prior to transportation to [phone] Transportation: [facility] to be scheduled at 2pm  CSW sent discharge summary to SNF for review.  [facility] [employer] authorization received.   11:57am- CSW received notification that [facility] did not have bed availability and therefore rescinded the bed offer.  [facility] is able to offer STR bed to patient.  CSW presented this bed offer to Renee (patient's step-daughter/caregiver) who is agreeable with this change.   Nonnie Done, LCSW [phone]  5N1-9; 2S 15-16 and [place] Licensed Clinical Social Worker

## 2015-07-27 NOTE — Care Management Important Message (Signed)
Important Message  Patient Details  Name: Bradley Roman MRN: [id number] Date of Birth: 01-24-1941   Medicare Important Message Given:  Yes    Casidy Alberta P [place] 07/27/2015, 3:30 PM

## 2015-07-27 NOTE — Progress Notes (Signed)
Physical Therapy Treatment Patient Details Name: Bradley Roman MRN: [id number] DOB: September 30, 1940 Today's Date: 07/27/2015    History of Present Illness  Admitted post fall resulting in L hip fx, now s/p ORIF L hip with IM Nail; has a past medical history of Cancer ([place]) and Dementia.    PT Comments    Noting improvements in mobility and activity tolerance  Follow Up Recommendations  SNF (will likely need ALF living sutation post SNF for rehab)     Equipment Recommendations  Rolling walker with 5" wheels;3in1 (PT)    Recommendations for Other Services       Precautions / Restrictions Precautions Precautions: Fall Restrictions Weight Bearing Restrictions: Yes LLE Weight Bearing: Weight bearing as tolerated    Mobility  Bed Mobility Overal bed mobility: Needs Assistance Bed Mobility: Supine to Sit     Supine to sit: Min assist     General bed mobility comments: Verbal and tactile cues for technique and initiation; Assist to help LLE to EOB and elevate trunk to sitting  Transfers Overall transfer level: Needs assistance Equipment used: Rolling walker (2 wheeled) Transfers: Sit to/from Stand Sit to Stand: Min assist         General transfer comment: Good power-up; noted he braces the backs of his LEs against bed for stability  Ambulation/Gait Ambulation/Gait assistance: +2 safety/equipment;Min assist Ambulation Distance (Feet): 50 Feet Assistive device: Rolling walker (2 wheeled) Gait Pattern/deviations: Step-through pattern;Decreased stance time - left Gait velocity: slow   General Gait Details: Noting more steady today with good support on RW   Stairs            Wheelchair Mobility    Modified Rankin (Stroke Patients Only)       Balance     Sitting balance-Leahy Scale: Fair       Standing balance-Leahy Scale: Poor                      Cognition Arousal/Alertness: Awake/alert Behavior During Therapy: WFL for tasks  assessed/performed Overall Cognitive Status: No family/caregiver present to determine baseline cognitive functioning Area of Impairment: Orientation;Memory Orientation Level: Disoriented to;Place;Time;Situation   Memory: Decreased short-term memory         General Comments: Unable to state date, place; Able to state his birthday    Exercises General Exercises - Lower Extremity Long Arc Quad: [name];Left;10 reps Heel Slides: AAROM;Left;10 reps Hip ABduction/ADduction: AAROM;Left;10 reps    General Comments        Pertinent Vitals/Pain Pain Assessment: Faces Faces Pain Scale: Hurts even more Pain Location: L hip and thigh Pain Descriptors / Indicators: Grimacing (and stating his L hip and thigh hurt) Pain Intervention(s): Limited activity within patient's tolerance;Monitored during session;Repositioned    Home Living                      Prior Function            PT Goals (current goals can now be found in the care plan section) Acute Rehab PT Goals Patient Stated Goal: did not state, but seemed happy to get OOB PT Goal Formulation: Patient unable to participate in goal setting Time For Goal Achievement: 08/08/15 Potential to Achieve Goals: Good Progress towards PT goals: Progressing toward goals    Frequency  Min 3X/week    PT Plan Current plan remains appropriate    Co-evaluation             End of Session Equipment Utilized During  Treatment: Gait belt Activity Tolerance: Patient tolerated treatment well Patient left: in chair;with call bell/phone within reach;with chair alarm set     Time: [id number] PT Time Calculation (min) (ACUTE ONLY): 17 min  Charges:  $Gait Training: 8-22 mins                    G Codes:      Roney Marion Hamff 07/27/2015, 11:06 AM  Roney Marion, [place] Pager [phone] Office [phone]

## 2015-08-03 ENCOUNTER — Inpatient Hospital Stay (HOSPITAL_COMMUNITY): Admission: RE | Admit: 2015-08-03 | Payer: Medicare Other | Source: Ambulatory Visit

## 2015-09-06 ENCOUNTER — Other Ambulatory Visit: Payer: Self-pay | Admitting: Urology

## 2015-09-06 DIAGNOSIS — C61 Malignant neoplasm of prostate: Secondary | ICD-10-CM

## 2015-09-16 ENCOUNTER — Ambulatory Visit (HOSPITAL_COMMUNITY): Payer: Medicare Other | Attending: Student | Admitting: Physical Therapy

## 2015-09-16 DIAGNOSIS — R2681 Unsteadiness on feet: Secondary | ICD-10-CM

## 2015-09-16 DIAGNOSIS — M6281 Muscle weakness (generalized): Secondary | ICD-10-CM | POA: Diagnosis present

## 2015-09-16 DIAGNOSIS — R262 Difficulty in walking, not elsewhere classified: Secondary | ICD-10-CM

## 2015-09-16 DIAGNOSIS — Z9181 History of falling: Secondary | ICD-10-CM

## 2015-09-16 DIAGNOSIS — M25552 Pain in left hip: Secondary | ICD-10-CM

## 2015-09-16 NOTE — Patient Instructions (Signed)
Bridge    Lie back, legs bent. Inhale, pressing hips up. Keeping ribs in, lengthen lower back. Exhale, rolling down along spine from top. Repeat __10__ times. Do _2___ sessions per day.  http://pm.exer.us/54   Copyright  VHI. All rights reserved.   Functional Quadriceps: Sit to Stand    Sit on edge of chair, feet flat on floor. Stand upright, extending knees fully, and without using hands. Repeat __10__ times per set. Do __1__ sets per session. Do _2___ sessions per day.  http://orth.exer.us/734     Copyright  VHI. All rights reserved.   Toe / Heel Raise    STANDING HEEL RAISES  While standing, raise up on your toes as you lift your heels off the ground.  Repeat 15 times, twice a day.

## 2015-09-16 NOTE — Therapy (Signed)
[place], [name], [address] Phone: [phone]   Fax:  [phone]  Physical Therapy Evaluation  Patient Details  Name: Bradley Roman MRN: [id number] Date of Birth: Aug 21, 1940 Referring Provider: Corky Sing   Encounter Date: 09/16/2015      PT End of Session - 09/16/15 1617    Visit Number 1   Number of Visits 12   Date for PT Re-Evaluation 10/14/15   Authorization Type BCBS Medicare    Authorization Time Period 09/16/15 to 10/14/15   Authorization - Visit Number 1   Authorization - Number of Visits 10   PT Start Time 1522   PT Stop Time 1600   PT Time Calculation (min) 38 min   Activity Tolerance Patient tolerated treatment well;Patient limited by pain   Behavior During Therapy [facility] for tasks assessed/performed      Past Medical History  Diagnosis Date  . Cancer ([place])   . Dementia     Past Surgical History  Procedure Laterality Date  . Intramedullary (im) nail intertrochanteric Left 07/24/2015    Procedure: INTRAMEDULLARY (IM) NAIL INTERTROCHANTRIC AFFIXUS NAIL;  Surgeon: Wylene Simmer, MD;  Location: [place];  Service: Orthopedics;  Laterality: Left;    There were no vitals filed for this visit.  Visit Diagnosis:  Left hip pain - Plan: PT plan of care cert/re-cert  Muscle weakness - Plan: PT plan of care cert/re-cert  Difficulty walking - Plan: PT plan of care cert/re-cert  Unsteadiness - Plan: PT plan of care cert/re-cert  Risk for falls - Plan: PT plan of care cert/re-cert      Subjective Assessment - 09/16/15 1524    Subjective Patietn fell due to his shoe being wet, he stepped up on a curb and his foot slipped; they did emergency surgery right away. Daughter reports that he just has a pin/nail, not a complete replacement. He was not using an assistive device before his fall; he did have HHPT which came for around 10 visits, has been discharged at this time. Patient lives with his daughter,  was very independent before; no close calls with falls around the house recently.    Pertinent History L hip fracture with IM nail repair in january 2017, dementia    How long can you sit comfortably? per daughter, sits a lot    How long can you stand comfortably? per daughter, does not stand in one spot a lot    How long can you walk comfortably? has not noticed a lot of limitations with his walking with walker   Patient Stated Goals reduce fall risk, get back to prior level of indepedence   Currently in Pain? Yes  unable to accuratley explain due to dementia             [facility] PT Assessment - 09/16/15 0001    Assessment   Medical Diagnosis L hip fracture, orthopedic aftercare    Referring Provider Marya Amsler [facility]    Onset Date/Surgical Date 07/24/15   Next MD Visit March 23rd with [facility]llis    Precautions   Precaution Comments WBAT    Restrictions   Weight Bearing Restrictions No   Balance Screen   Has the patient fallen in the past 6 months Yes   How many times? 1   Has the patient had a decrease in activity level because of a fear of falling?  No   Is the patient reluctant to leave their home because of a  fear of falling?  No   Prior Function   Level of Independence Independent   Vocation Retired   Leisure nothing, really pretty sedentary    Posture/Postural Control   Posture Comments flexed at hips, reduced weight bearing L LE in general    Strength   Overall Strength Comments generally at least 3/5, unable to perform accurate specific MMT due to patient difficulty following PT commands today, likely secondary to dementia    Transfers   Five time sit to stand comments  24.13, B UEs    Ambulation/Gait   Gait Comments flexed athips, unsteady, flexed at hips, reduced gait speed, proximal msucle weakness, reduced stance  L/step R    6 minute walk test results    Aerobic Endurance Distance Walked 283   Endurance additional comments 3WMT, walker    Timed Up and Go Test    Normal TUG (seconds) 20.48   TUG Comments walker, mod cues                            PT Education - 09/16/15 1616    Education provided Yes   Education Details educated daughter on POC, HEP; patient unable to consume education secondary to dementia    Person(s) Educated Theatre stage manager)   Methods Explanation;Handout   Comprehension Verbalized understanding;Need further instruction          PT Short Term Goals - 09/16/15 1638    PT SHORT TERM GOAL #1   Title Patient to be able to ambulate at least 520ft with LRAD during 3MWT, minimal fatigue and unsteadiness, in order to improve general mobilty and assist in returning to PLOF    Time 2   Period Weeks   Status New   PT SHORT TERM GOAL #2   Title Patient to be able to perform TUG in 13 seconds or less with LRAD in order to demonstrate improved general mobiltiy and balance    Time 2   Period Weeks   Status New   PT SHORT TERM GOAL #3   Title Patient will be able to perform five time sit to stand in 13 seconds or less with out UE support in order to demonstrate improved muscle strength and improved general mobiltiy    Time 2   Period Weeks   Status New   PT SHORT TERM GOAL #4   Title Patient's family will consistently and correctly have patient perform HEP, to be advanced PRN    Time 2   Period Weeks   Status New           PT Long Term Goals - 09/16/15 1653    PT LONG TERM GOAL #1   Title Patient will demonstrate incresaed functional strength as evidenced by increased ease of functional tasks including sit to stand/stand to sit, performance of functional lunges and squats, and reduced general L hip pain    Time 4   Period Weeks   Status New   PT LONG TERM GOAL #2   Title Patient to experience reduction in L hip releated pain as evidenced by change in patient pain report from "lots of pain" to "just a little bit of pain" with functional activity, as he was not able to accurately rate pain on VAS 0-10    Time 4   Period Weeks   Status New   PT LONG TERM GOAL #3   Title Patient's daughter to report he has been virtually unlimited in gait  over even and uneven surfaces with LRAD, minimal pain, in order to assist in optimizing general mobilty and independence    Time 4   Period Weeks   Status New   PT LONG TERM GOAL #4   Title Patient's daughter to report that patient has been able to complete all mobilty at home and in commuity safely with LRAD or no device, no falls within the past 4 weeks, in order to demonstrate return to PLOF    Time 4   Period Weeks   Status New               Plan - Oct 02, 2015 1618    Clinical Impression Statement Patient arrives with his daughter and grandchildren; initially came back by himself, however PT did bring daughter back for interview/history due to patient history of dementia. Patient had fall in mid-januray and was for emergency surgery, during whiich his hip was secured with a nail per MD note. Attemted to assess strength through MMT but patient had difficulty following detailned instructions and PT was unable to obtain accurate MMT measures; otherwise perforemd pure functional testing such as 3MWT, TUG, transfers. Patient also able to complete toileting with S from PT, however did require S for safety as he does continually leave his walker and attempts to ambulate without it. Overall patient demosntrates definitie muscle weakness, impaired gait, unsteadiness, reduced functional activity tolerance, L hip pain, reduced safety awareness, and reduced functional independence due to injury. At this time recoomend trial of skilled PT services in order to attempt to address functional impairments and assist in reaching optimal level of function.    Pt will benefit from skilled therapeutic intervention in order to improve on the following deficits Abnormal gait;Decreased activity tolerance;Decreased strength;Pain;Decreased balance;Difficulty walking;Improper body  mechanics   Rehab Potential Good   PT Frequency 3x / week   PT Duration 4 weeks   PT Treatment/Interventions ADLs/Self Care Home Management;Biofeedback;Cryotherapy;Electrical Stimulation;DME Instruction;Gait training;Stair training;Functional mobility training;Therapeutic activities;Therapeutic exercise;Balance training;Neuromuscular re-education;Patient/family education;Manual techniques;Energy conservation;Taping;Scar mobilization   PT Next Visit Plan review HEP and goals with daughter/family; functional stretching and strengthening, balance, gait training. Patient does have dementia and responds best to demonstration with minimal verbal cuing   PT Home Exercise Plan given    Consulted and Agree with Plan of Care Family member/caregiver   Family Member Consulted daughter/caregiver           G-Codes - 02-Oct-2015 1710    Functional Assessment Tool Used Based on skilled clinical assessment of strength, gait, balance, functional activity toelrance, pain    Functional Limitation Mobility: Walking and moving around   Mobility: Walking and Moving Around Current Status [id number]) At least 60 percent but less than 80 percent impaired, limited or restricted   Mobility: Walking and Moving Around Goal Status [phone]) At least 40 percent but less than 60 percent impaired, limited or restricted       Problem List Patient Active Problem List   Diagnosis Date Noted  . Postoperative anemia due to acute blood loss 07/26/2015  . Obesity (BMI 30-39.9) 07/25/2015  . Hip fracture, left ([place]) 07/24/2015  . Hyperglycemia 07/24/2015  . Dementia 07/24/2015  . Closed intertrochanteric fracture of left femur ([place]) 07/24/2015    Deniece Ree PT, DPT [facility] [address] [place], [name], [address] Phone: [phone]   Fax:  [phone]  Name: Bradley Roman MRN: [id number] Date of Birth: 06-28-1941

## 2015-09-20 ENCOUNTER — Ambulatory Visit (HOSPITAL_COMMUNITY): Payer: Medicare Other | Admitting: Physical Therapy

## 2015-09-20 DIAGNOSIS — M6281 Muscle weakness (generalized): Secondary | ICD-10-CM

## 2015-09-20 DIAGNOSIS — M25552 Pain in left hip: Secondary | ICD-10-CM

## 2015-09-20 DIAGNOSIS — R262 Difficulty in walking, not elsewhere classified: Secondary | ICD-10-CM

## 2015-09-20 DIAGNOSIS — Z9181 History of falling: Secondary | ICD-10-CM

## 2015-09-20 DIAGNOSIS — R2681 Unsteadiness on feet: Secondary | ICD-10-CM

## 2015-09-20 NOTE — Therapy (Signed)
[facility] [address] [place], [name], [address] Phone: [phone]   Fax:  [phone]  Physical Therapy Treatment  Patient Details  Name: ZALEN KRALY MRN: [id number] Date of Birth: [birth year] Referring Provider: Corky Sing   Encounter Date: 09/20/2015      PT End of Session - 09/20/15 1708    Visit Number 2   Number of Visits 12   Date for PT Re-Evaluation 10/14/15   Authorization Type BCBS Medicare    Authorization Time Period 09/16/15 to 10/14/15   Authorization - Visit Number 2   Authorization - Number of Visits 10   PT Start Time [id number]   PT Stop Time 1725   PT Time Calculation (min) 40 min   Activity Tolerance Patient tolerated treatment well;Patient limited by pain   Behavior During Therapy [facility] for tasks assessed/performed      Past Medical History  Diagnosis Date  . Cancer ([place])   . Dementia     Past Surgical History  Procedure Laterality Date  . Intramedullary (im) nail intertrochanteric Left 07/24/2015    Procedure: INTRAMEDULLARY (IM) NAIL INTERTROCHANTRIC AFFIXUS NAIL;  Surgeon: Wylene Simmer, MD;  Location: [place];  Service: Orthopedics;  Laterality: Left;    There were no vitals filed for this visit.  Visit Diagnosis:  Left hip pain  Muscle weakness  Difficulty walking  Unsteadiness  Risk for falls      Subjective Assessment - 09/20/15 1653    Subjective PT states his Lt hip hurts, however unable to give a number (pt with dementia).  PT with antalgic gait, however not with any other pain behaviors.   Currently in Pain? Yes   Pain Orientation Left                         [facility] Adult PT Treatment/Exercise - 09/20/15 1700    Knee/Hip Exercises: Standing   Heel Raises Both;10 reps   Heel Raises Limitations toeraises both 10 reps   Hip Abduction Both;15 reps   Abduction Limitations with tactile cues for form and control   Functional Squat 10 reps   Gait Training RW 3 minutes  300 feet    Knee/Hip Exercises: Seated   Long Arc Quad Both;10 reps   Sit to [employer] 10 reps;without UE support   Knee/Hip Exercises: Supine   Bridges Both;15 reps;2 sets   Straight Leg Raises Both;10 reps;2 sets   Knee/Hip Exercises: Sidelying   Hip ABduction Left;10 reps;2 sets                PT Education - 09/20/15 1715    Education provided Yes   Education Details Daughter given copy of initial evaluation with explanation of goals and measurements.     Person(s) Educated Child(ren)   Methods Explanation;Handout   Comprehension Verbalized understanding          PT Short Term Goals - 09/16/15 1638    PT SHORT TERM GOAL #1   Title Patient to be able to ambulate at least 516ft with LRAD during 3MWT, minimal fatigue and unsteadiness, in order to improve general mobilty and assist in returning to PLOF    Time 2   Period Weeks   Status New   PT SHORT TERM GOAL #2   Title Patient to be able to perform TUG in 13 seconds or less with LRAD in order to demonstrate improved general mobiltiy and balance    Time 2  Period Weeks   Status New   PT SHORT TERM GOAL #3   Title Patient will be able to perform five time sit to stand in 13 seconds or less with out UE support in order to demonstrate improved muscle strength and improved general mobiltiy    Time 2   Period Weeks   Status New   PT SHORT TERM GOAL #4   Title Patient's family will consistently and correctly have patient perform HEP, to be advanced PRN    Time 2   Period Weeks   Status New           PT Long Term Goals - 09/16/15 1653    PT LONG TERM GOAL #1   Title Patient will demonstrate incresaed functional strength as evidenced by increased ease of functional tasks including sit to stand/stand to sit, performance of functional lunges and squats, and reduced general L hip pain    Time 4   Period Weeks   Status New   PT LONG TERM GOAL #2   Title Patient to experience reduction in L hip releated pain as  evidenced by change in patient pain report from "lots of pain" to "just a little bit of pain" with functional activity, as he was not able to accurately rate pain on VAS 0-10   Time 4   Period Weeks   Status New   PT LONG TERM GOAL #3   Title Patient's daughter to report he has been virtually unlimited in gait over even and uneven surfaces with LRAD, minimal pain, in order to assist in optimizing general mobilty and independence    Time 4   Period Weeks   Status New   PT LONG TERM GOAL #4   Title Patient's daughter to report that patient has been able to complete all mobilty at home and in commuity safely with LRAD or no device, no falls within the past 4 weeks, in order to demonstrate return to PLOF    Time 4   Period Weeks   Status New               Plan - 09/20/15 1709    Clinical Impression Statement PT able to complete 300 feet in 3 minutes (goal is 500 feet). Reviewed established therex with max tactile and verbal cues and added additional LE strengthening therex.  Pt requires max cues with all therex as patient has dementia and diffiuclty following instructions.  PT tends to complete therex too quickly with lack of control.  Does well with AAROM and tactile cues to correct form.     Pt will benefit from skilled therapeutic intervention in order to improve on the following deficits Abnormal gait;Decreased activity tolerance;Decreased strength;Pain;Decreased balance;Difficulty walking;Improper body mechanics   Rehab Potential Good   PT Frequency 3x / week   PT Duration 4 weeks   PT Treatment/Interventions ADLs/Self Care Home Management;Biofeedback;Cryotherapy;Electrical Stimulation;DME Instruction;Gait training;Stair training;Functional mobility training;Therapeutic activities;Therapeutic exercise;Balance training;Neuromuscular re-education;Patient/family education;Manual techniques;Energy conservation;Taping;Scar mobilization   PT Next Visit Plan review HEP and goals with  daughter/family; functional stretching and strengthening, balance, gait training. Patient does have dementia and responds best to demonstration with minimal verbal cuing   PT Home Exercise Plan given    Consulted and Agree with Plan of Care Family member/caregiver   Family Member Consulted daughter/caregiver         Problem List Patient Active Problem List   Diagnosis Date Noted  . Postoperative anemia due to acute blood loss 07/26/2015  . Obesity (BMI  30-39.9) 07/25/2015  . Hip fracture, left ([place]) 07/24/2015  . Hyperglycemia 07/24/2015  . Dementia 07/24/2015  . Closed intertrochanteric fracture of left femur ([place]) 07/24/2015    Teena Irani, PTA/CLT [phone]  09/20/2015, 5:17 PM  [facility] [address] [place], [name], [address] Phone: [phone]   Fax:  [phone]  Name: ALBEN KOSIER MRN: [id number] Date of Birth: 1940-08-15

## 2015-09-29 ENCOUNTER — Encounter (HOSPITAL_COMMUNITY): Payer: Medicare Other | Admitting: Physical Therapy

## 2015-09-30 ENCOUNTER — Ambulatory Visit (HOSPITAL_COMMUNITY): Payer: Medicare Other

## 2015-09-30 ENCOUNTER — Other Ambulatory Visit: Payer: Self-pay | Admitting: Urology

## 2015-09-30 DIAGNOSIS — R262 Difficulty in walking, not elsewhere classified: Secondary | ICD-10-CM

## 2015-09-30 DIAGNOSIS — Z9181 History of falling: Secondary | ICD-10-CM

## 2015-09-30 DIAGNOSIS — M6281 Muscle weakness (generalized): Secondary | ICD-10-CM

## 2015-09-30 DIAGNOSIS — M25552 Pain in left hip: Secondary | ICD-10-CM

## 2015-09-30 DIAGNOSIS — R972 Elevated prostate specific antigen [PSA]: Secondary | ICD-10-CM

## 2015-09-30 DIAGNOSIS — R2681 Unsteadiness on feet: Secondary | ICD-10-CM

## 2015-09-30 NOTE — Therapy (Signed)
[facility] [address] [place], [name], [address] Phone: [phone]   Fax:  [phone]  Physical Therapy Treatment  Patient Details  Name: Bradley Roman MRN: [id number] Date of Birth: Nov 19, 1940 Referring Provider: Corky Sing   Encounter Date: 09/30/2015      PT End of Session - 09/30/15 1328    Visit Number 3   Number of Visits 12   Date for PT Re-Evaluation 10/14/15   Authorization Type BCBS Medicare    Authorization Time Period 09/16/15 to 10/14/15   Authorization - Visit Number 3   Authorization - Number of Visits 10   PT Start Time 1304   PT Stop Time 1342   PT Time Calculation (min) 38 min   Equipment Utilized During Treatment Gait belt   Activity Tolerance Patient tolerated treatment well   Behavior During Therapy [facility] for tasks assessed/performed      Past Medical History  Diagnosis Date  . Cancer ([place])   . Dementia     Past Surgical History  Procedure Laterality Date  . Intramedullary (im) nail intertrochanteric Left 07/24/2015    Procedure: INTRAMEDULLARY (IM) NAIL INTERTROCHANTRIC AFFIXUS NAIL;  Surgeon: Wylene Simmer, MD;  Location: [place];  Service: Orthopedics;  Laterality: Left;    There were no vitals filed for this visit.  Visit Diagnosis:  Left hip pain  Muscle weakness  Difficulty walking  Unsteadiness  Risk for falls      Subjective Assessment - 09/30/15 1315    Subjective Mild anterior thigh pain with gait when asked, otherwise, pt is very gracious and pleased today. He expressed gratitiude for being here to work with PT.    Pertinent History L hip fracture with IM nail repair in january 2017, dementia    How long can you sit comfortably? per daughter, sits a lot    How long can you stand comfortably? per daughter, does not stand in one spot a lot    How long can you walk comfortably? has not noticed a lot of limitations with his walking with walker   Patient Stated Goals reduce fall  risk, get back to prior level of indepedence   Currently in Pain? Yes  L thigh pain mild, but not concerning. antalgic gait.                          [facility] Adult PT Treatment/Exercise - 09/30/15 0001    Ambulation/Gait   Ambulation/Gait Yes   Ambulation/Gait Assistance 4: Min guard   Ambulation Distance (Feet) 635 Feet  then 2x225' (RT) during session   Assistive device Rolling walker   Ambulation Surface Level;Indoor   Knee/Hip Exercises: Standing   Heel Raises 2 sets;20 reps  c RW    Hip Flexion 2 sets;10 reps  // bars c visual cues   Gait Training *see above   Knee/Hip Exercises: Seated   Long Arc Quad 2 sets;10 reps  4# cuffs   Knee/Hip Exercises: Supine   Bridges 10 reps;2 sets   Other Supine Knee/Hip Exercises hamstrings bridge  14" box, 2x10                  PT Short Term Goals - 09/16/15 1638    PT SHORT TERM GOAL #1   Title Patient to be able to ambulate at least 561ft with LRAD during 3MWT, minimal fatigue and unsteadiness, in order to improve general mobilty and assist in returning to [facility]  Time 2   Period Weeks   Status New   PT SHORT TERM GOAL #2   Title Patient to be able to perform TUG in 13 seconds or less with LRAD in order to demonstrate improved general mobiltiy and balance    Time 2   Period Weeks   Status New   PT SHORT TERM GOAL #3   Title Patient will be able to perform five time sit to stand in 13 seconds or less with out UE support in order to demonstrate improved muscle strength and improved general mobiltiy    Time 2   Period Weeks   Status New   PT SHORT TERM GOAL #4   Title Patient's family will consistently and correctly have patient perform HEP, to be advanced PRN    Time 2   Period Weeks   Status New           PT Long Term Goals - 09/16/15 1653    PT LONG TERM GOAL #1   Title Patient will demonstrate incresaed functional strength as evidenced by increased ease of functional tasks including sit to  stand/stand to sit, performance of functional lunges and squats, and reduced general L hip pain    Time 4   Period Weeks   Status New   PT LONG TERM GOAL #2   Title Patient to experience reduction in L hip releated pain as evidenced by change in patient pain report from "lots of pain" to "just a little bit of pain" with functional activity, as he was not able to accurately rate pain on VAS 0-10   Time 4   Period Weeks   Status New   PT LONG TERM GOAL #3   Title Patient's daughter to report he has been virtually unlimited in gait over even and uneven surfaces with LRAD, minimal pain, in order to assist in optimizing general mobilty and independence    Time 4   Period Weeks   Status New   PT LONG TERM GOAL #4   Title Patient's daughter to report that patient has been able to complete all mobilty at home and in commuity safely with LRAD or no device, no falls within the past 4 weeks, in order to demonstrate return to PLOF    Time 4   Period Weeks   Status New               Plan - 09/30/15 1334    Clinical Impression Statement Pt making good progress toward goals, including improved tolerance to ambulation, decreased pain with all actiivty and improved strength with all exercises. 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    Pt will benefit from skilled therapeutic intervention in order to improve on the following deficits Abnormal gait;Decreased activity tolerance;Decreased strength;Pain;Decreased balance;Difficulty walking;Improper body mechanics   Rehab Potential Good   PT Frequency 3x / week   PT Duration 4 weeks   PT Treatment/Interventions ADLs/Self Care Home Management;Biofeedback;Cryotherapy;Electrical Stimulation;DME Instruction;Gait training;Stair training;Functional mobility training;Therapeutic activities;Therapeutic exercise;Balance training;Neuromuscular  re-education;Patient/family education;Manual techniques;Energy conservation;Taping;Scar mobilization   PT Next Visit Plan assess need for heel lift on LLE for LLD, continue to progress ambulation distance.    PT Home Exercise Plan no changes    Consulted and Agree with Plan of Care Family member/caregiver   Family Member Consulted daughter/caregiver         Problem List Patient Active Problem List   Diagnosis  Date Noted  . Postoperative anemia due to acute blood loss 07/26/2015  . Obesity (BMI 30-39.9) 07/25/2015  . Hip fracture, left ([place]) 07/24/2015  . Hyperglycemia 07/24/2015  . Dementia 07/24/2015  . Closed intertrochanteric fracture of left femur ([place]) 07/24/2015    1:41 PM, 09/30/2015 Etta Grandchild, PT, DPT PRN Physical Therapist at [facility] # [id number] (wireless)  [phone] (mobile)       [facility] [place], [name], [address] Phone: [phone]   Fax:  [phone]  Name: Bradley Roman MRN: [id number] Date of Birth: 1941-02-26

## 2015-10-05 ENCOUNTER — Ambulatory Visit (HOSPITAL_COMMUNITY)
Admission: RE | Admit: 2015-10-05 | Discharge: 2015-10-05 | Disposition: A | Discharge status: Home / Self Care | Payer: Medicare Other | Source: Ambulatory Visit | Attending: Urology | Admitting: Urology

## 2015-10-05 ENCOUNTER — Ambulatory Visit (HOSPITAL_COMMUNITY): Payer: Medicare Other

## 2015-10-05 ENCOUNTER — Encounter (HOSPITAL_COMMUNITY): Payer: Self-pay

## 2015-10-05 DIAGNOSIS — M6281 Muscle weakness (generalized): Secondary | ICD-10-CM

## 2015-10-05 DIAGNOSIS — C61 Malignant neoplasm of prostate: Secondary | ICD-10-CM | POA: Insufficient documentation

## 2015-10-05 DIAGNOSIS — M25552 Pain in left hip: Secondary | ICD-10-CM | POA: Diagnosis not present

## 2015-10-05 DIAGNOSIS — R262 Difficulty in walking, not elsewhere classified: Secondary | ICD-10-CM

## 2015-10-05 DIAGNOSIS — R2681 Unsteadiness on feet: Secondary | ICD-10-CM

## 2015-10-05 DIAGNOSIS — Z9181 History of falling: Secondary | ICD-10-CM

## 2015-10-05 MED ORDER — GENTAMICIN SULFATE 40 MG/ML IJ SOLN
INTRAMUSCULAR | Status: AC
Start: 2015-10-05 — End: 2015-10-05
  Administered 2015-10-05: 160 mg via INTRAMUSCULAR
  Filled 2015-10-05: qty 4

## 2015-10-05 MED ORDER — GENTAMICIN SULFATE 40 MG/ML IJ SOLN
160.0000 mg | Freq: Once | INTRAMUSCULAR | Status: AC
  Administered 2015-10-05: 160 mg via INTRAMUSCULAR

## 2015-10-05 MED ORDER — LIDOCAINE HCL (PF) 2 % IJ SOLN
INTRAMUSCULAR | Status: AC
  Administered 2015-10-05: 10 mL
  Filled 2015-10-05: qty 10

## 2015-10-05 NOTE — Therapy (Signed)
[facility] [address] [place], [name], [address] Phone: [phone]   Fax:  [phone]  Physical Therapy Treatment  Patient Details  Name: Bradley Roman MRN: [id number] Date of Birth: 1940-11-04 Referring Provider: Corky Sing   Encounter Date: 10/05/2015      PT End of Session - 10/05/15 1427    Visit Number 4   Number of Visits 12   Date for PT Re-Evaluation 10/14/15   Authorization Type BC[employer] Medicare    Authorization Time Period 09/16/15 to 10/14/15   Authorization - Visit Number 4   Authorization - Number of Visits 10   PT Start Time [id number]   PT Stop Time 1427   PT Time Calculation (min) 39 min   Equipment Utilized During Treatment Gait belt   Activity Tolerance Patient tolerated treatment well   Behavior During [facility] for tasks assessed/performed      Past Medical History  Diagnosis Date  . Cancer ([place])   . Dementia     Past Surgical History  Procedure Laterality Date  . Intramedullary (im) nail intertrochanteric Left 07/24/2015    Procedure: INTRAMEDULLARY (IM) NAIL INTERTROCHANTRIC AFFIXUS NAIL;  Surgeon: Wylene Simmer, MD;  Location: [facility];  Service: Orthopedics;  Laterality: Left;    There were no vitals filed for this visit.  Visit Diagnosis:  Left hip pain  Muscle weakness  Difficulty walking  Unsteadiness  Risk for falls      Subjective Assessment - 10/05/15 1351    Subjective Pt stated he continues to have lateral Lt hip, reports is it feeling a little better.  Pt is very gracious and pleased to complete therapy today.  Pt did not reports a pain scale today, facial expression minimal pain today.  (pt with dementia). Pt and daughter reports non-compliance with HEP.   Pertinent History L hip fracture with IM nail repair in january 2017, dementia    Patient Stated Goals reduce fall risk, get back to prior level of indepedence   Currently in Pain? Yes  reports minimal pain   Pain Score --   unable to rate, facial expressoion ~2/10 Lt hip pain   Pain Location Leg   Pain Orientation Left   Pain Descriptors / Indicators Aching;Sore            [facility] Adult PT Treatment/Exercise - 10/05/15 0001    Ambulation/Gait   Ambulation/Gait Yes   Ambulation/Gait Assistance 4: Min guard   Ambulation Distance (Feet) 300 Feet   Assistive device Rolling walker   Ambulation Surface Level;Indoor   Gait Comments flexed athips, unsteady, flexed at hips, reduced gait speed, proximal msucle weakness, reduced stance  L/step R    Knee/Hip Exercises: Standing   Heel Raises 2 sets;20 reps   Heel Raises Limitations toeraises both 10 reps   Hip Flexion 2 sets;10 reps  // c visual cues   Hip Abduction Both;15 reps   Abduction Limitations with tactile cues for form and control   Gait Training RW 300 feet with RW   Knee/Hip Exercises: Seated   Long Arc Quad 2 sets;15 reps  4#   Sit to [employer] 10 reps;without UE support   Knee/Hip Exercises: Supine   Bridges 10 reps;2 sets   Other Supine Knee/Hip Exercises hamstrings bridge   Knee/Hip Exercises: Sidelying   Hip ABduction Both;2 sets;10 reps                PT Education - 10/05/15 1420    Education provided  Yes   Education Details Discussed LLD with daughter, educated on benefits of heel raises   Person(s) Educated Child(ren)   Methods Explanation   Comprehension Verbalized understanding          PT Short Term Goals - 09/16/15 1638    PT SHORT TERM GOAL #1   Title Patient to be able to ambulate at least 560ft with LRAD during 3MWT, minimal fatigue and unsteadiness, in order to improve general mobilty and assist in returning to PLOF    Time 2   Period Weeks   Status New   PT SHORT TERM GOAL #2   Title Patient to be able to perform TUG in 13 seconds or less with LRAD in order to demonstrate improved general mobiltiy and balance    Time 2   Period Weeks   Status New   PT SHORT TERM GOAL #3   Title Patient will be able to  perform five time sit to stand in 13 seconds or less with out UE support in order to demonstrate improved muscle strength and improved general mobiltiy    Time 2   Period Weeks   Status New   PT SHORT TERM GOAL #4   Title Patient's family will consistently and correctly have patient perform HEP, to be advanced PRN    Time 2   Period Weeks   Status New           PT Long Term Goals - 09/16/15 1653    PT LONG TERM GOAL #1   Title Patient will demonstrate incresaed functional strength as evidenced by increased ease of functional tasks including sit to stand/stand to sit, performance of functional lunges and squats, and reduced general L hip pain    Time 4   Period Weeks   Status New   PT LONG TERM GOAL #2   Title Patient to experience reduction in L hip releated pain as evidenced by change in patient pain report from "lots of pain" to "just a little bit of pain" with functional activity, as he was not able to accurately rate pain on VAS 0-10   Time 4   Period Weeks   Status New   PT LONG TERM GOAL #3   Title Patient's daughter to report he has been virtually unlimited in gait over even and uneven surfaces with LRAD, minimal pain, in order to assist in optimizing general mobilty and independence    Time 4   Period Weeks   Status New   PT LONG TERM GOAL #4   Title Patient's daughter to report that patient has been able to complete all mobilty at home and in commuity safely with LRAD or no device, no falls within the past 4 weeks, in order to demonstrate return to PLOF    Time 4   Period Weeks   Status New               Plan - 10/05/15 1428    Clinical Impression Statement Measurements complete with noted 1 in Lt LE LLD, daughter educated on benefits of heel raises to improve gait mechanics.  Session foucs on improving gait mechanics and improving tolerance and functional strengthening.  Pt required multimodal cueing to improve all exercises for proper form and technique,  demonstration most beneficial for follow through with exercise.  No reports of increased pain through session, was limited by fatigue with activities.     Pt will benefit from skilled therapeutic intervention in order to improve on the following  deficits Abnormal gait;Decreased activity tolerance;Decreased strength;Pain;Decreased balance;Difficulty walking;Improper body mechanics   Rehab Potential Good   PT Frequency 3x / week   PT Duration 4 weeks   PT Treatment/Interventions ADLs/Self Care Home Management;Biofeedback;Cryotherapy;Electrical Stimulation;DME Instruction;Gait training;Stair training;Functional mobility training;Therapeutic activities;Therapeutic exercise;Balance training;Neuromuscular re-education;Patient/family education;Manual techniques;Energy conservation;Taping;Scar mobilization   PT Next Visit Plan Continue education with daughter/family for maximal benefits; functional stretching and strengthening, balance, gait training.    Consulted and Agree with Plan of Care Family member/caregiver   Family Member Consulted daughter/caregiver         Problem List Patient Active Problem List   Diagnosis Date Noted  . Postoperative anemia due to acute blood loss 07/26/2015  . Obesity (BMI 30-39.9) 07/25/2015  . Hip fracture, left ([place]) 07/24/2015  . Hyperglycemia 07/24/2015  . Dementia 07/24/2015  . Closed intertrochanteric fracture of left femur [facility]) 07/24/2015   Ihor Austin, LPTA; CBIS [phone]  Aldona Lento 10/05/2015, 6:38 PM  [facility] [place], [name], [address] Phone: [phone]   Fax:  [phone]  Name: JERZY OBROCHTA MRN: [id number] Date of Birth: 04/29/1941

## 2015-10-07 ENCOUNTER — Encounter (HOSPITAL_COMMUNITY): Payer: Medicare Other

## 2015-10-12 ENCOUNTER — Encounter (HOSPITAL_COMMUNITY): Payer: Medicare Other | Admitting: Physical Therapy

## 2015-10-12 ENCOUNTER — Ambulatory Visit: Payer: Medicare Other | Admitting: Urology

## 2015-10-14 ENCOUNTER — Ambulatory Visit (HOSPITAL_COMMUNITY): Payer: Medicare Other | Admitting: Physical Therapy

## 2015-12-21 ENCOUNTER — Ambulatory Visit (INDEPENDENT_AMBULATORY_CARE_PROVIDER_SITE_OTHER): Payer: Medicare Other | Admitting: Urology

## 2015-12-21 DIAGNOSIS — C61 Malignant neoplasm of prostate: Secondary | ICD-10-CM | POA: Diagnosis not present

## 2016-01-14 ENCOUNTER — Encounter (HOSPITAL_COMMUNITY): Payer: Self-pay | Admitting: Emergency Medicine

## 2016-01-14 ENCOUNTER — Emergency Department (HOSPITAL_COMMUNITY)
Admission: EM | Admit: 2016-01-14 | Discharge: 2016-01-14 | Disposition: A | Discharge status: Home / Self Care | Payer: Medicare Other | Attending: Emergency Medicine | Admitting: Emergency Medicine

## 2016-01-14 DIAGNOSIS — Z87891 Personal history of nicotine dependence: Secondary | ICD-10-CM | POA: Insufficient documentation

## 2016-01-14 DIAGNOSIS — Z79899 Other long term (current) drug therapy: Secondary | ICD-10-CM | POA: Diagnosis not present

## 2016-01-14 DIAGNOSIS — F0390 Unspecified dementia without behavioral disturbance: Secondary | ICD-10-CM | POA: Diagnosis not present

## 2016-01-14 LAB — COMPREHENSIVE METABOLIC PANEL
ALBUMIN: 3.4 g/dL — AB (ref 3.5–5.0)
ALT: 16 U/L — AB (ref 17–63)
AST: 21 U/L (ref 15–41)
Alkaline Phosphatase: 73 U/L (ref 38–126)
Anion gap: 1 — ABNORMAL LOW (ref 5–15)
BILIRUBIN TOTAL: 0.5 mg/dL (ref 0.3–1.2)
BUN: 14 mg/dL (ref 6–20)
CO2: 29 mmol/L (ref 22–32)
Calcium: 8.8 mg/dL — ABNORMAL LOW (ref 8.9–10.3)
Chloride: 109 mmol/L (ref 101–111)
Creatinine, Ser: 0.89 mg/dL (ref 0.61–1.24)
GFR calc Af Amer: >60 mL/min (ref >60)
GFR calc non Af Amer: >60 mL/min (ref >60)
GLUCOSE: 97 mg/dL (ref 65–99)
POTASSIUM: 4 mmol/L (ref 3.5–5.1)
Sodium: 139 mmol/L (ref 135–145)
TOTAL PROTEIN: 7 g/dL (ref 6.5–8.1)

## 2016-01-14 LAB — CBC WITH DIFFERENTIAL/PLATELET
BASOS PCT: 2 %
Basophils Absolute: 0.1 10*3/uL (ref 0.0–0.1)
Eosinophils Absolute: 0.1 10*3/uL (ref 0.0–0.7)
Eosinophils Relative: 3 %
HEMATOCRIT: 33.3 % — AB (ref 39.0–52.0)
Hemoglobin: 10.8 g/dL — ABNORMAL LOW (ref 13.0–17.0)
LYMPHS PCT: 17 %
Lymphs Abs: 0.6 10*3/uL — ABNORMAL LOW (ref 0.7–4.0)
MCH: 33.2 pg (ref 26.0–34.0)
MCHC: 32.4 g/dL (ref 30.0–36.0)
MCV: 102.5 fL — AB (ref 78.0–100.0)
MONO ABS: 0.6 10*3/uL (ref 0.1–1.0)
MONOS PCT: 16 %
NEUTROS ABS: 2.2 10*3/uL (ref 1.7–7.7)
Neutrophils Relative %: 62 %
Platelets: 184 10*3/uL (ref 150–400)
RBC: 3.25 MIL/uL — ABNORMAL LOW (ref 4.22–5.81)
RDW: 13.8 % (ref 11.5–15.5)
WBC: 3.6 10*3/uL — ABNORMAL LOW (ref 4.0–10.5)

## 2016-01-14 LAB — URINALYSIS, ROUTINE W REFLEX MICROSCOPIC
BILIRUBIN URINE: NEGATIVE
GLUCOSE, UA: NEGATIVE mg/dL
KETONES UR: NEGATIVE mg/dL
Nitrite: NEGATIVE
PH: 5.5 (ref 5.0–8.0)
Protein, ur: NEGATIVE mg/dL
Specific Gravity, Urine: 1.02 (ref 1.005–1.030)

## 2016-01-14 LAB — URINE MICROSCOPIC-ADD ON

## 2016-01-14 LAB — LIPASE, BLOOD: Lipase: 19 U/L (ref 11–51)

## 2016-01-14 NOTE — ED Notes (Signed)
Pt walked out to front lobby while family member was out of room. Brought back to room and changed to gown without any problems. Staff aware to monitor and make frequent checks

## 2016-01-14 NOTE — ED Provider Notes (Signed)
Emergency Department Provider Note  Time seen: Approximately 11:31 AM  I have reviewed the triage vital signs and the nursing notes.   HISTORY  Chief Complaint Dementia  HPI Bradley Roman is a 75 y.o. male with PMH of dementia presents to the ED for evaluation of worsening dementia and agitation. The patient is pleasantly demented and unable to provide significant HPI or ROS. The patient's stepdaughter bedside states that she is the primary caregiver in her home. Over the past several months he has become increasingly agitated and difficult to deal with. The patient's family stated they have multiple young children at home which makes it even more difficult. She reports that he is refusing to take his medications, attempting to drive, and at times verbally abusive. Denies any physical abuse. She states he's symptoms have been much worse over the past 2 weeks. She is currently working with a case manager to arrange placement but was referred to the emergency department for acute worsening of home symptoms and inability to care for the patient. Family member denies any obvious medical complaints such as fever, chills, productive cough, complaints of pain.   Past Medical History  Diagnosis Date  . Cancer ([place])   . Dementia     Patient Active Problem List   Diagnosis Date Noted  . Postoperative anemia due to acute blood loss 07/26/2015  . Obesity (BMI 30-39.9) 07/25/2015  . Hip fracture, left ([place]) 07/24/2015  . Hyperglycemia 07/24/2015  . Dementia 07/24/2015  . Closed intertrochanteric fracture of left femur ([facility]) 07/24/2015    Past Surgical History  Procedure Laterality Date  . Intramedullary (im) nail intertrochanteric Left 07/24/2015    Procedure: INTRAMEDULLARY (IM) NAIL INTERTROCHANTRIC AFFIXUS NAIL;  Surgeon: Wylene Simmer, MD;  Location: [facility];  Service: Orthopedics;  Laterality: Left;    Current Outpatient Rx  Name  Route  Sig  Dispense  Refill  . acetaminophen (TYLENOL)  325 MG tablet   Oral   Take 2 tablets (650 mg total) by mouth every 6 (six) hours as needed for mild pain (or Fever >/= 101).         Marland Kitchen donepezil (ARICEPT) 10 MG tablet   Oral   Take 1 tablet by mouth at bedtime.      4   . EXPIRED: enoxaparin (LOVENOX) 40 MG/0.4ML injection   Subcutaneous   Inject 0.4 mLs (40 mg total) into the skin daily. For 2 weeks   0 Syringe      . HYDROcodone-acetaminophen (NORCO/VICODIN) 5-325 MG tablet   Oral   Take 1 tablet by mouth every 6 (six) hours as needed for severe pain (breakthrough pain).   20 tablet   0   . senna-docusate (SENOKOT-S) 8.6-50 MG tablet   Oral   Take 2 tablets by mouth daily.           Allergies Review of patient's allergies indicates no known allergies.  No family history on file.  Social History Social History  Substance Use Topics  . Smoking status: Former Research scientist (life sciences)  . Smokeless tobacco: None  . Alcohol Use: No    Review of Systems  Limited by dementia.   ____________________________________________   PHYSICAL EXAM:  VITAL SIGNS: ED Triage Vitals  Enc Vitals Group     BP 01/14/16 1120 139/62 mmHg     Pulse Rate 01/14/16 1120 65     Resp 01/14/16 1120 16     Temp 01/14/16 1120 97.9 F (36.6 C)     Temp Source  01/14/16 1120 Oral     SpO2 01/14/16 1120 99 %     Weight 01/14/16 1120 200 lb (90.719 kg)     Height 01/14/16 1120 5\' 6"  (1.676 m)    Constitutional: Alert , pleasant but very confused. Well appearing and in no acute distress. Eyes: Conjunctivae are normal. PERRL.   Head: Atraumatic. Mouth/Throat: Mucous membranes are moist.  Oropharynx non-erythematous. Neck: No stridor.  Cardiovascular: Normal rate, regular rhythm. Good peripheral circulation. Grossly normal heart sounds.   Respiratory: Normal respiratory effort.  No retractions. Lungs CTAB. Gastrointestinal: Soft and nontender. No distention.  Musculoskeletal: No lower extremity tenderness nor edema. No gross deformities of  extremities. Neurologic:  Normal speech and language with confusion as noted above. No gross focal neurologic deficits are appreciated.  Skin:  Skin is warm, dry and intact. No rash noted. Psychiatric: Mood and affect are elevated. Pleasantly demented.   ____________________________________________   LABS (all labs ordered are listed, but only abnormal results are displayed)  Labs Reviewed  COMPREHENSIVE METABOLIC PANEL - Abnormal; Notable for the following:    Calcium 8.8 (*)    Albumin 3.4 (*)    ALT 16 (*)    Anion gap 1 (*)    All other components within normal limits  CBC WITH DIFFERENTIAL/PLATELET - Abnormal; Notable for the following:    WBC 3.6 (*)    RBC 3.25 (*)    Hemoglobin 10.8 (*)    HCT 33.3 (*)    MCV 102.5 (*)    Lymphs Abs 0.6 (*)    All other components within normal limits  URINALYSIS, ROUTINE W REFLEX MICROSCOPIC (NOT AT [facility]) - Abnormal; Notable for the following:    Hgb urine dipstick MODERATE (*)    Leukocytes, UA LARGE (*)    All other components within normal limits  URINE MICROSCOPIC-ADD ON - Abnormal; Notable for the following:    Squamous Epithelial / LPF 0-5 (*)    Bacteria, UA FEW (*)    All other components within normal limits  LIPASE, BLOOD   ______________________________________   PROCEDURES  Procedure(s) performed:   Procedures  None ____________________________________________   INITIAL IMPRESSION / ASSESSMENT AND PLAN / ED COURSE  Pertinent labs & imaging results that were available during my care of the patient were reviewed by me and considered in my medical decision making (see chart for details).  Patient presents to the emergency department for evaluation of worsening dementia and inability of family care for the patient at home. He is verbally abusive, attempting to drive, and not taking his medications. On my exam the patient is pleasantly demented. Does not appear acutely delirious at this time. Plan for AMS w/u and  will consult with the case manager.   12:46 PM Labs reviewed and are nonspecific. No clear indication for medical admission at this time. Social Work consult pending.   01:41 PM Appreciate SW consultation. See attached note. Assisted family resources and called several facilities to initiate placement.  02:03 PM  Updated family who have already been evaluated for a potential placement in the ED. Reviewed the case and will discharge at this time. Discussed return precautions in detail.   ____________________________________________  FINAL CLINICAL IMPRESSION(S) / ED DIAGNOSES  Final diagnoses:  Dementia, without behavioral disturbance     MEDICATIONS GIVEN DURING THIS VISIT:  None  NEW OUTPATIENT MEDICATIONS STARTED DURING THIS VISIT:  None   Note:  This document was prepared using Dragon voice recognition software and may include unintentional dictation errors.  Nanda Quinton, MD Emergency Medicine  Margette Fast, MD 01/14/16 [phone]

## 2016-01-14 NOTE — ED Notes (Signed)
Pt has dementia, getting worse, Lives with step daughter, today wanting to drive, will not take medication, Arguing. She can not handle him anymore, tearful,  Wanting placement. Glenda Chroman form social services told her to bring him to ED to get help with placement. She has children in the small home and can not care for him

## 2016-01-14 NOTE — ED Notes (Signed)
Pt brought in by step daughter, he has lived with her for 6 years. She reports that dementia is increasingly worse over last few months. Becomes angry and cusses her. Pt still thinks he can drives and becomes angry when she wont give him the keys. She states he wont take a bath or changes clothes and becomes argumentative with her.He throws his meds away. Forgets he eats. Step daughter has been talking with SS for placements and was told to bring to ED for placement if she is unable to handle. Pt is neatly dressed, but has a body odor smell. Pt is continent

## 2016-01-14 NOTE — Clinical Social Work Note (Signed)
CSW met with pt and pt's step-daughter, Renee in ED. Pt pleasantly confused and repeatedly asked, "Am I in good shape? Thank the good Lord for that." Renee brought pt to ED upon recommendation by DSS caseworker for placement in facility due to worsening dementia. Renee shared that she is raising her grandsons and that she is at her wits end. Pt has been throwing his medication in the trash and argues about everything. She reports that he is fairly independent with ADLs and is supposed to ambulate with a walker. Family is with pt around the clock for safety as he sometimes says he is going to leave. Discussed with Joseph Art that we are unable to place pt from ED, but provided extensive information on placement from home. She is aware of different levels of care and agrees to discuss with PCP whether pt needs a memory care unit. CSW called ALF in room with Renee's permission and no beds available at this time. ALF/FCH list provided. Renee plans to call facilities and visit to hopefully find best fit for pt. CSW also spoke with Glenda Chroman, caseworker at [facility] and notified her that pt would be returning home to continue placement efforts. Renee declined any home health or private duty care list. Lattie Haw plans to also speak with Joseph Art to share information regarding Medicaid application which is pending. Joseph Art was appreciative of information. Updated EDP. Will sign off, but can be reconsulted if needed.   Benay Pike, [place]

## 2016-01-14 NOTE — Discharge Instructions (Signed)
You were seen in the ED today with dementia. Your medical evaluation was unremarkable and I believe that your symptoms are caused by a progressive disease process. The social worker met with you to assist with arranging placement from home.   Return to the ED with any new or worsening symptoms.  Dementia Dementia is a general term for problems with brain function. A person with dementia has memory loss and a hard time with at least one other brain function such as thinking, speaking, or problem solving. Dementia can affect social functioning, how you do your job, your mood, or your personality. The changes may be hidden for a Bradley Roman time. The earliest forms of this disease are usually not detected by family or friends. Dementia can be:  Irreversible.  Potentially reversible.  Partially reversible.  Progressive. This means it can get worse over time. CAUSES  Irreversible dementia causes may include:  Degeneration of brain cells (Alzheimer disease or Lewy body dementia).  Multiple small strokes (vascular dementia).  Infection (chronic meningitis or Creutzfeldt-Jakob disease).  Frontotemporal dementia. This affects younger people, age 59 to 20, compared to those who have Alzheimer disease.  Dementia associated with other disorders like Parkinson disease, Huntington disease, or HIV-associated dementia. Potentially or partially reversible dementia causes may include:  Medicines.  Metabolic causes such as excessive alcohol intake, vitamin B12 deficiency, or thyroid disease.  Masses or pressure in the brain such as a tumor, blood clot, or hydrocephalus. SIGNS AND SYMPTOMS  Symptoms are often hard to detect. Family members or coworkers may not notice them early in the disease process. Different people with dementia may have different symptoms. Symptoms can include:  A hard time with memory, especially recent memory. Bradley Roman-term memory may not be impaired.  Asking the same question multiple  times or forgetting something someone just said.  A hard time speaking your thoughts or finding certain words.  A hard time solving problems or performing familiar tasks (such as how to use a telephone).  Sudden changes in mood.  Changes in personality, especially increasing moodiness or mistrust.  Depression.  A hard time understanding complex ideas that were never a problem in the past. DIAGNOSIS  There are no specific tests for dementia.   Your health care provider may recommend a thorough evaluation. This is because some forms of dementia can be reversible. The evaluation will likely include a physical exam and getting a detailed history from you and a family member. The history often gives the best clues and suggestions for a diagnosis.  Memory testing may be done. A detailed brain function evaluation called neuropsychologic testing may be helpful.  Lab tests and brain imaging (such as a CT scan or MRI scan) are sometimes important.  Sometimes observation and re-evaluation over time is very helpful. TREATMENT  Treatment depends on the cause.   If the problem is a vitamin deficiency, it may be helped or cured with supplements.  For dementias such as Alzheimer disease, medicines are available to stabilize or slow the course of the disease. There are no cures for this type of dementia.  Your health care provider can help direct you to groups, organizations, and other health care providers to help with decisions in the care of you or your loved one. HOME CARE INSTRUCTIONS The care of individuals with dementia is varied and dependent upon the progression of the dementia. The following suggestions are intended for the person living with, or caring for, the person with dementia.  Create a safe environment.  Remove the locks on bathroom doors to prevent the person from accidentally locking himself or herself in.  Use childproof latches on kitchen cabinets and any place where  cleaning supplies, chemicals, or alcohol are kept.  Use childproof covers in unused electrical outlets.  Install childproof devices to keep doors and windows secured.  Remove stove knobs or install safety knobs and an automatic shut-off on the stove.  Lower the temperature on water heaters.  Label medicines and keep them locked up.  Secure knives, lighters, matches, power tools, and guns, and keep these items out of reach.  Keep the house free from clutter. Remove rugs or anything that might contribute to a fall.  Remove objects that might break and hurt the person.  Make sure lighting is good, both inside and outside.  Install grab rails as needed.  Use a monitoring device to alert you to falls or other needs for help.  Reduce confusion.  Keep familiar objects and people around.  Use night lights or dim lights at night.  Label items or areas.  Use reminders, notes, or directions for daily activities or tasks.  Keep a simple, consistent routine for waking, meals, bathing, dressing, and bedtime.  Create a calm, quiet environment.  Place large clocks and calendars prominently.  Display emergency numbers and home address near all telephones.  Use cues to establish different times of the day. An example is to open curtains to let the natural light in during the day.   Use effective communication.  Choose simple words and short sentences.  Use a gentle, calm tone of voice.  Be careful not to interrupt.  If the person is struggling to find a word or communicate a thought, try to provide the word or thought.  Ask one question at a time. Allow the person ample time to answer questions. Repeat the question again if the person does not respond.  Reduce nighttime restlessness.  Provide a comfortable bed.  Have a consistent nighttime routine.  Ensure a regular walking or physical activity schedule. Involve the person in daily activities as much as possible.  Limit  napping during the day.  Limit caffeine.  Attend social events that stimulate rather than overwhelm the senses.  Encourage good nutrition and hydration.  Reduce distractions during meal times and snacks.  Avoid foods that are too hot or too cold.  Monitor chewing and swallowing ability.  Continue with routine vision, hearing, dental, and medical screenings.  Give medicines only as directed by the health care provider.  Monitor driving abilities. Do not allow the person to drive when safe driving is no longer possible.  Register with an identification program which could provide location assistance in the event of a missing person situation. SEEK MEDICAL CARE IF:   New behavioral problems start such as moodiness, aggressiveness, or seeing things that are not there (hallucinations).  Any new problem with brain function happens. This includes problems with balance, speech, or falling a lot.  Problems with swallowing develop.  Any symptoms of other illness happen. Small changes or worsening in any aspect of brain function can be a sign that the illness is getting worse. It can also be a sign of another medical illness such as infection. Seeing a health care provider right away is important. SEEK IMMEDIATE MEDICAL CARE IF:   A fever develops.  New or worsened confusion develops.  New or worsened sleepiness develops.  Staying awake becomes hard to do.   This information is not intended to  replace advice given to you by your health care provider. Make sure you discuss any questions you have with your health care provider.   Document Released: 12/20/2000 Document Revised: 07/17/2014 Document Reviewed: 11/21/2010 Elsevier Interactive Patient Education [employer].

## 2016-03-09 ENCOUNTER — Encounter (HOSPITAL_COMMUNITY): Payer: Self-pay | Admitting: Physical Therapy

## 2016-03-09 NOTE — Therapy (Signed)
[facility] [place], [name], [address] Phone: [phone]   Fax:  [phone]  Patient Details  Name: Bradley Roman MRN: [id number] Date of Birth: 17-Dec-1940 Referring Provider:  No ref. provider found  Encounter Date: 03/09/2016   PHYSICAL THERAPY DISCHARGE SUMMARY  Visits from Start of Care: 4  Current functional level related to goals / functional outcomes: Patient has not returned since last skilled session    Remaining deficits: Unable to assess    Education / Equipment: N/A  Plan: Patient agrees to discharge.  Patient goals were not met. Patient is being discharged due to not returning since the last visit.  ?????       Deniece Ree PT, DPT [facility] [address] [place], [name], [address] Phone: [phone]   Fax:  [phone]

## 2016-04-19 ENCOUNTER — Ambulatory Visit (INDEPENDENT_AMBULATORY_CARE_PROVIDER_SITE_OTHER): Payer: Medicare Other | Admitting: Urology

## 2016-04-19 DIAGNOSIS — C61 Malignant neoplasm of prostate: Secondary | ICD-10-CM

## 2016-04-21 ENCOUNTER — Ambulatory Visit (INDEPENDENT_AMBULATORY_CARE_PROVIDER_SITE_OTHER): Payer: Medicare Other | Admitting: Urology

## 2016-04-21 DIAGNOSIS — C61 Malignant neoplasm of prostate: Secondary | ICD-10-CM | POA: Diagnosis not present

## 2016-10-30 IMAGING — RF DG HIP (WITH PELVIS) OPERATIVE*L*
1 series · 4 of 4 positions shown · non-contrast
Comparison: 07/24/2015

FLUOROSCOPY TIME:  1 minute 12 seconds

Images obtained: 4

CLINICAL DATA: LEFT hip fracture, IM nail

EXAM:
OPERATIVE LEFT HIP (WITH PELVIS IF PERFORMED) 4 VIEWS
TECHNIQUE: Fluoroscopic spot image(s) were submitted for interpretation
post-operatively.

[Series 1: run · 4 of 4 slices shown]
[im 1/4]
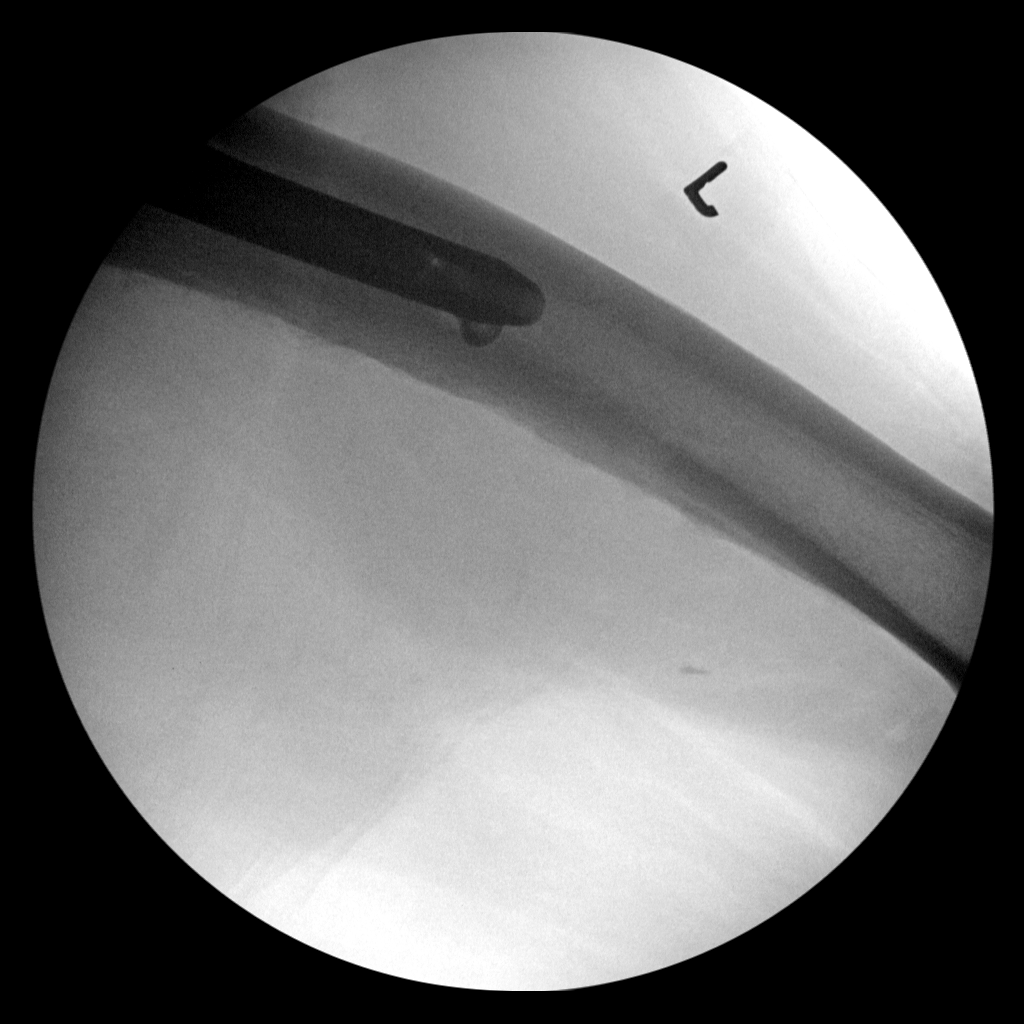
[im 2/4]
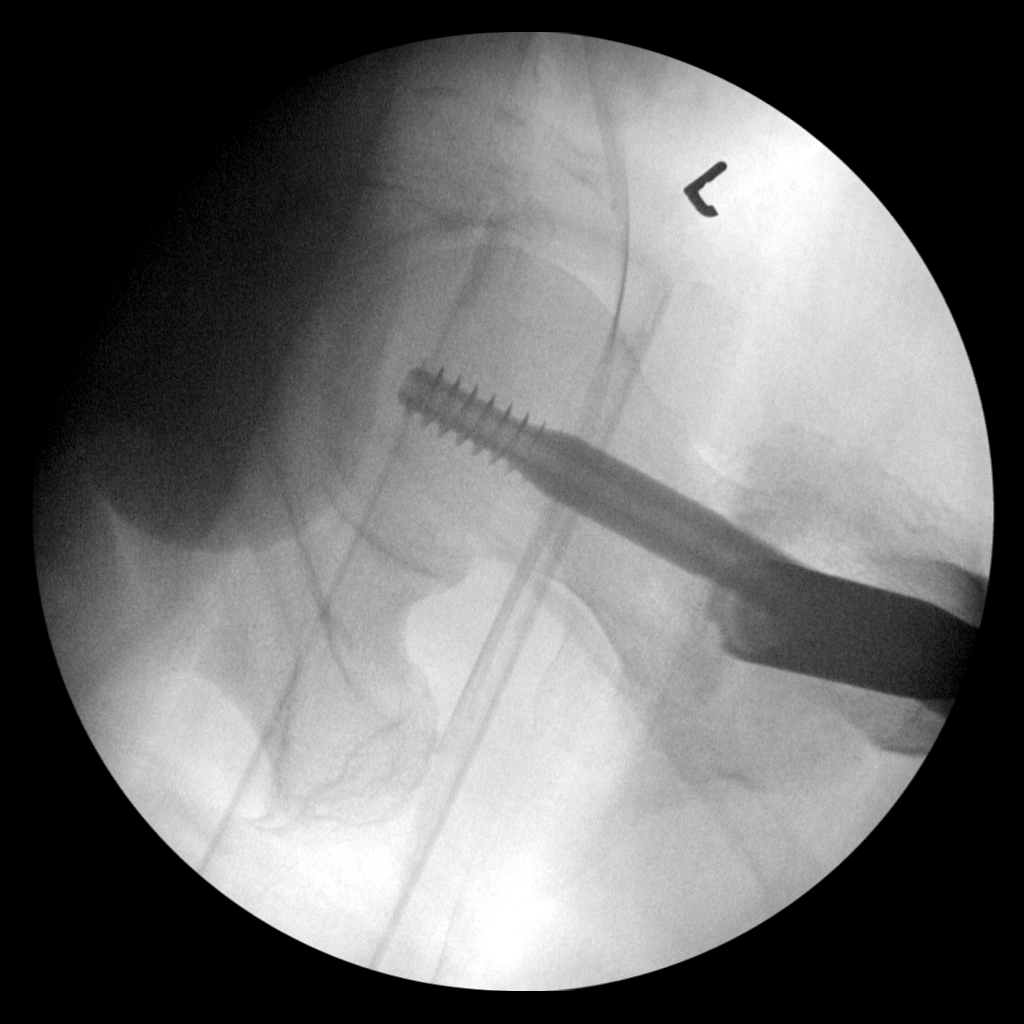
[im 3/4]
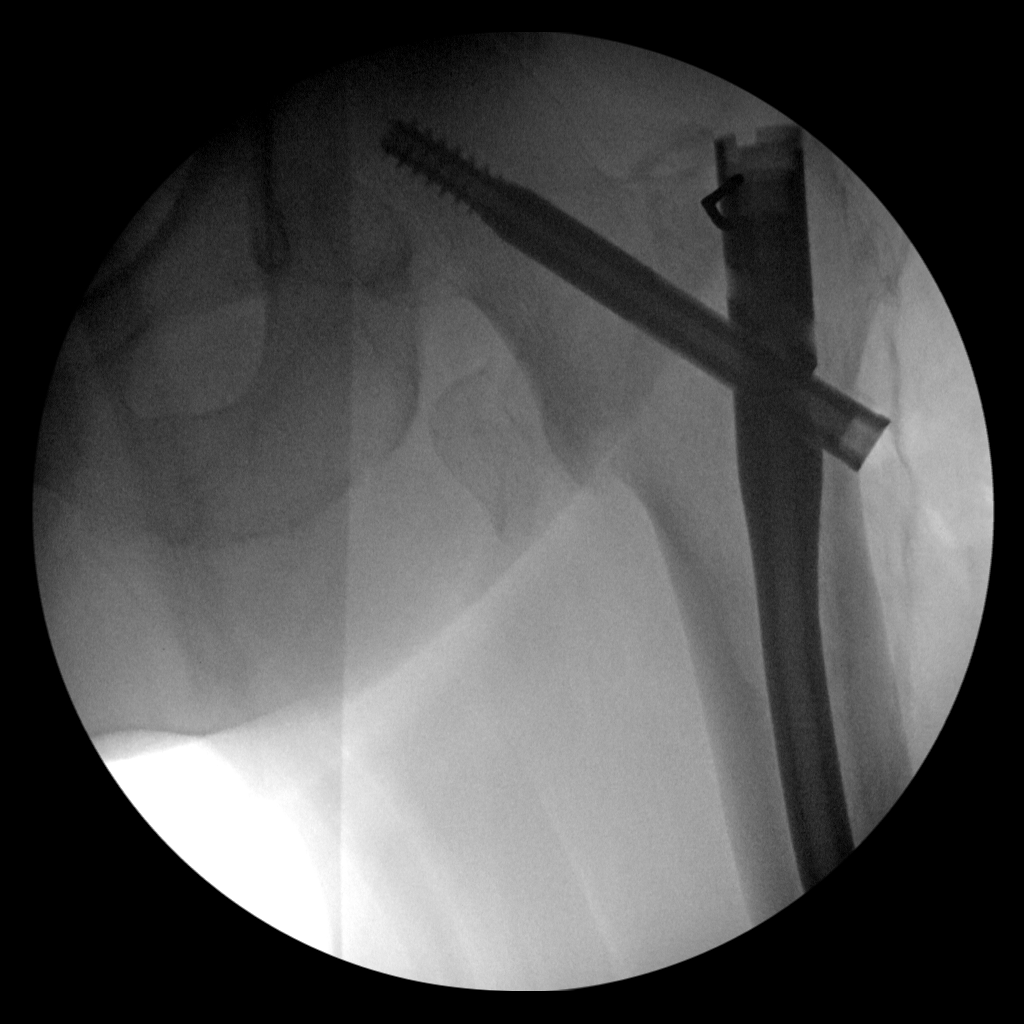
[im 4/4]
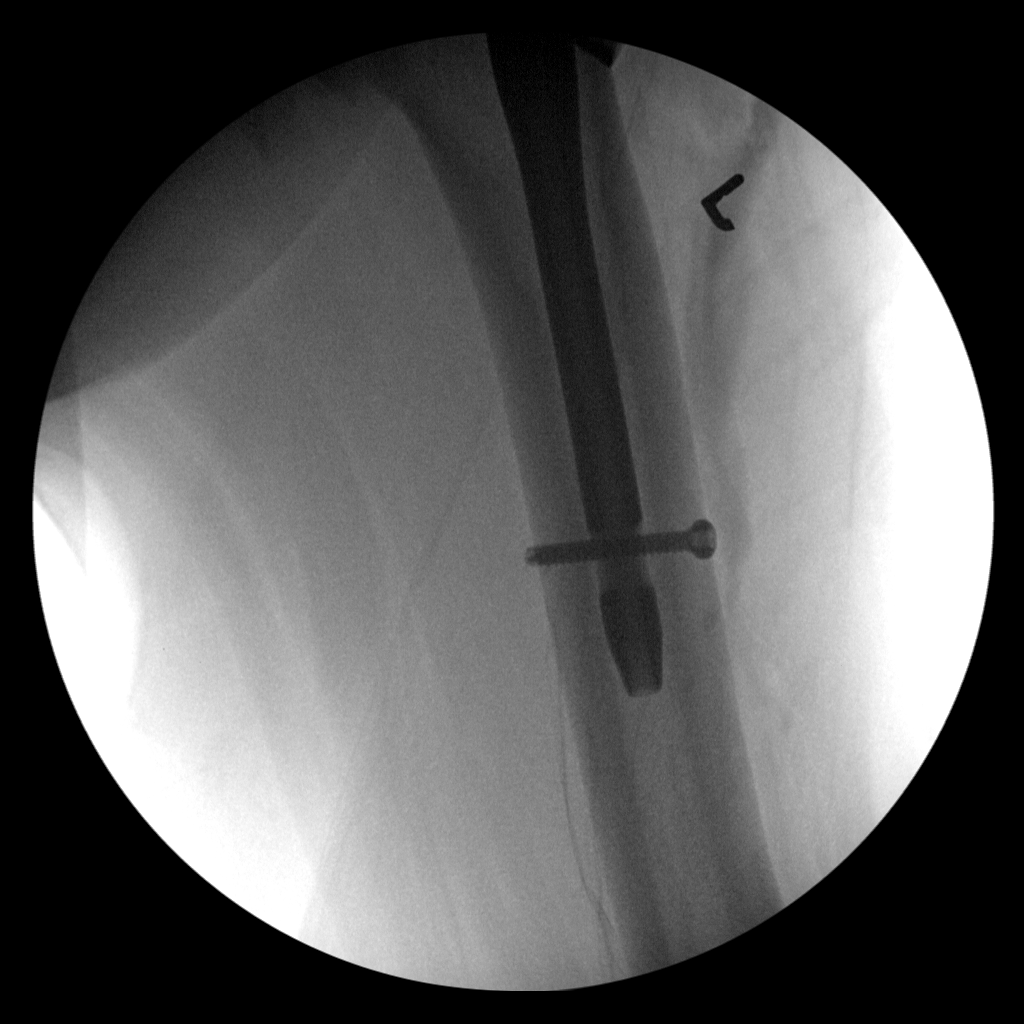

[4 of 4 positions shown; findings below may reference images not displayed]

FINDINGS: Four digital C-arm fluoroscopic images obtained intraoperatively
demonstrate placement of an IM nail with compression screw at the
proximal LEFT femur post ORIF of an intertrochanteric fracture.

Displaced lesser trochanter fracture fragment again seen.

No dislocation.

Bones appear demineralized.
IMPRESSION: Post ORIF of the previously identified intertrochanteric fracture
LEFT femur.

## 2016-10-30 IMAGING — DX DG CHEST 1V
1 series · 1 of 1 positions shown · non-contrast
Comparison: 12/22/2009

CLINICAL DATA: Left hip fracture.  Preop respiratory exam.

EXAM:
CHEST 1 VIEW

[chest pa]
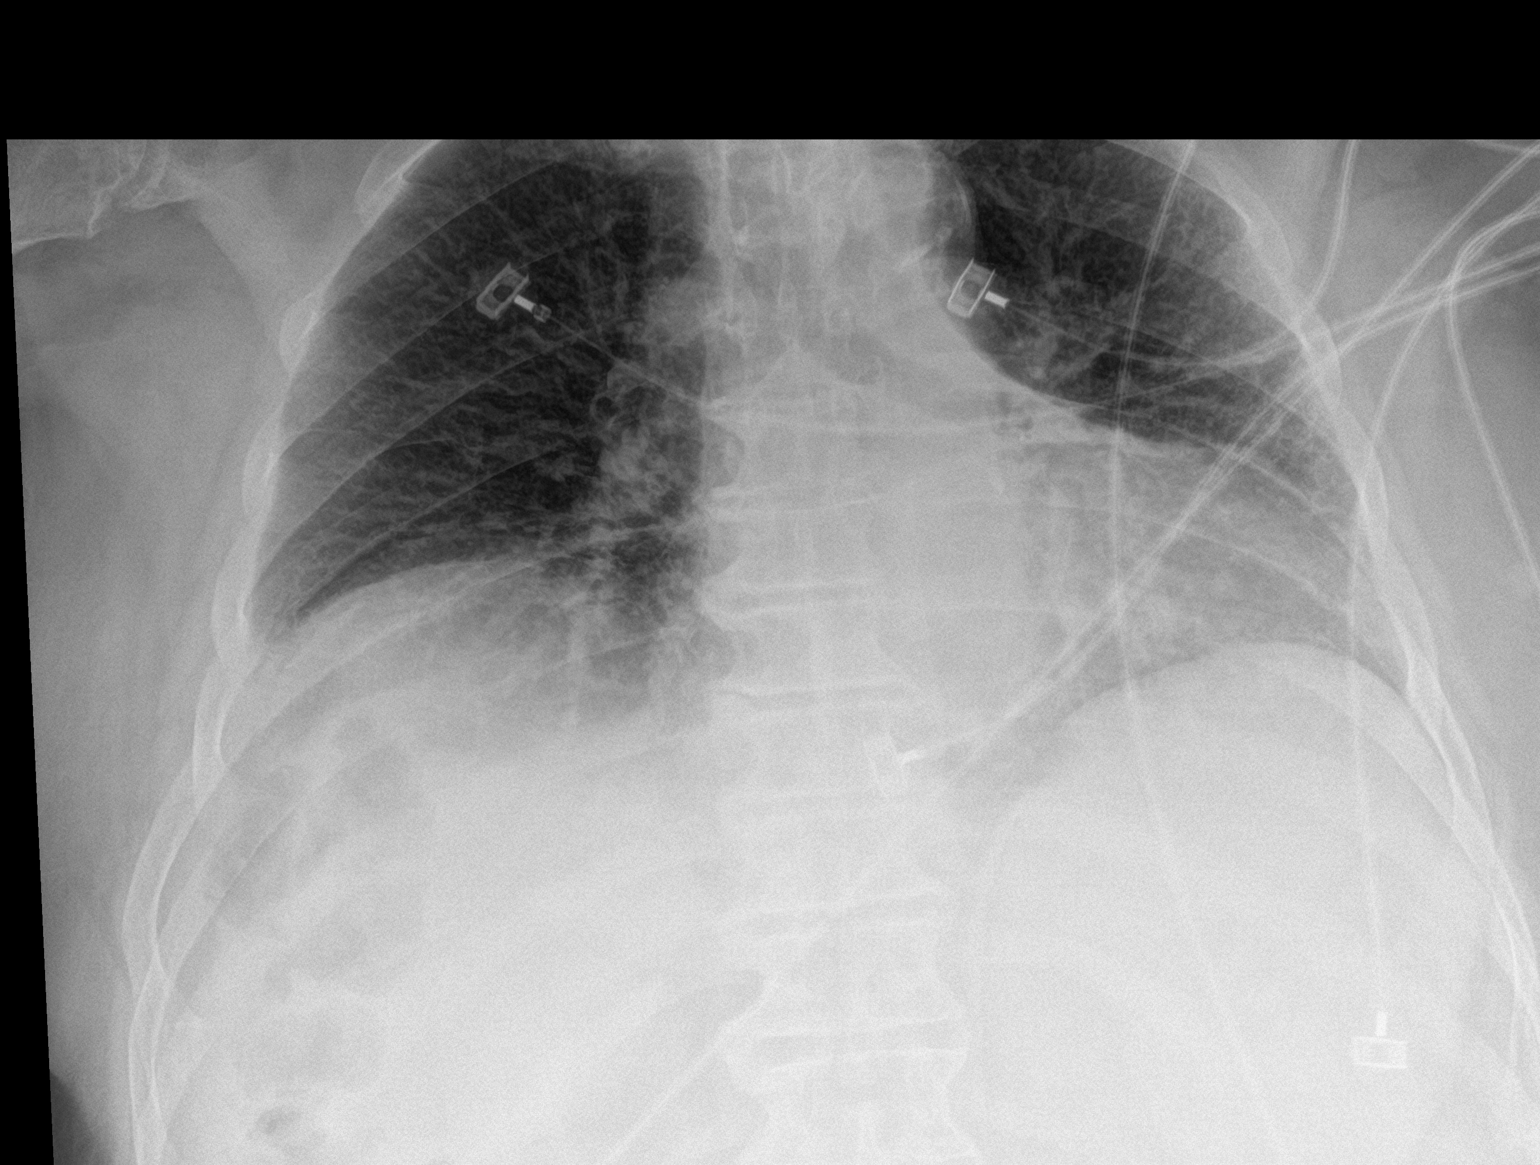

[1 of 1 positions shown; findings below may reference images not displayed]

FINDINGS: Low lung volumes are noted. Heart size is within normal limits. No
evidence pulmonary infiltrate, pleural effusion, or pneumothorax.
IMPRESSION: Low lung volumes.  No active disease.

## 2017-01-11 IMAGING — US US GUIDANCE NEEDLE PLACEMENT
1 series · 5 of 5 positions shown · non-contrast
Comparison: none

[Series 1: us guidance needle placement · 0.08mm/px · 5 of 5 slices shown]
[im 1/5]
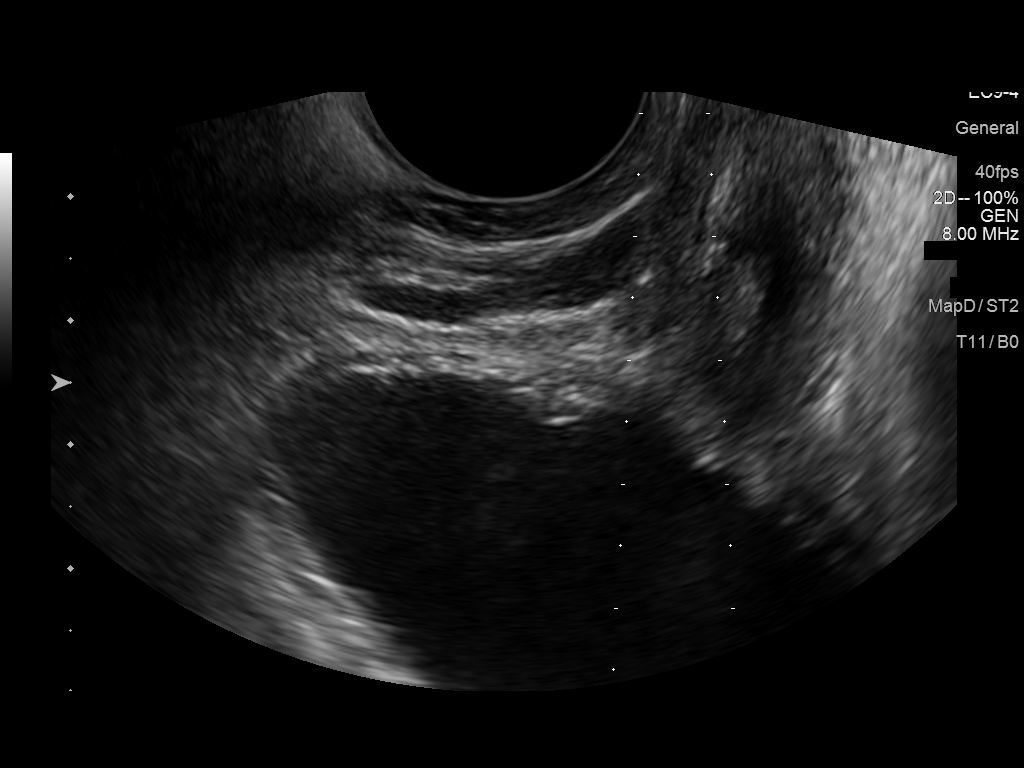
[im 2/5]
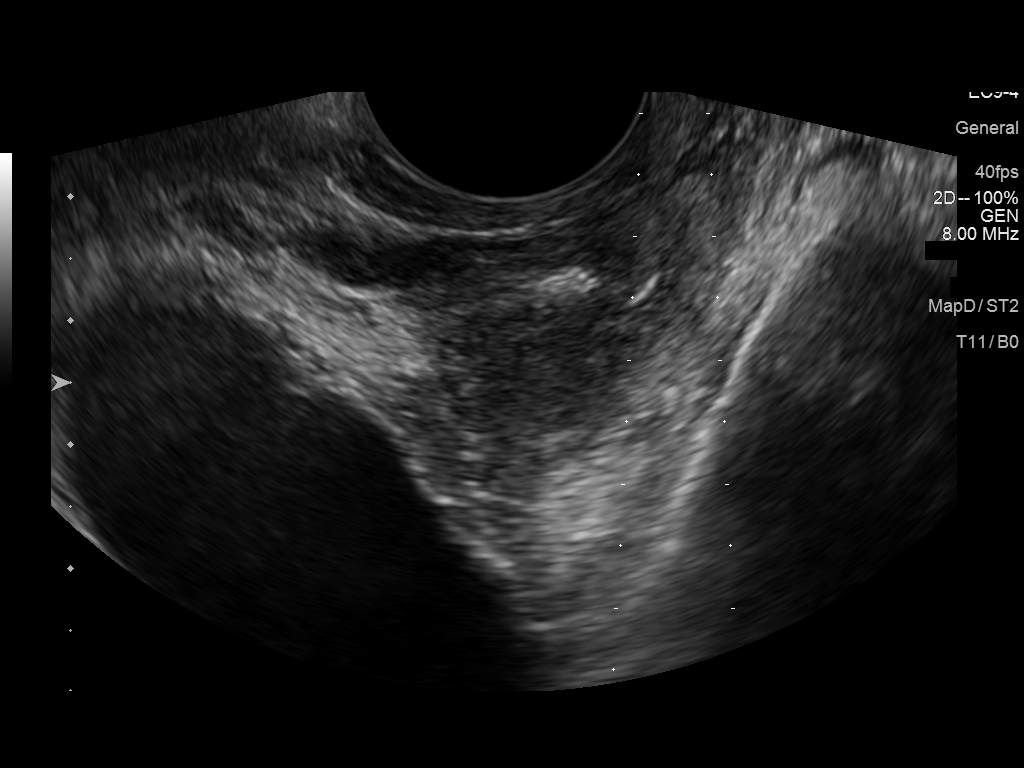
[im 3/5]
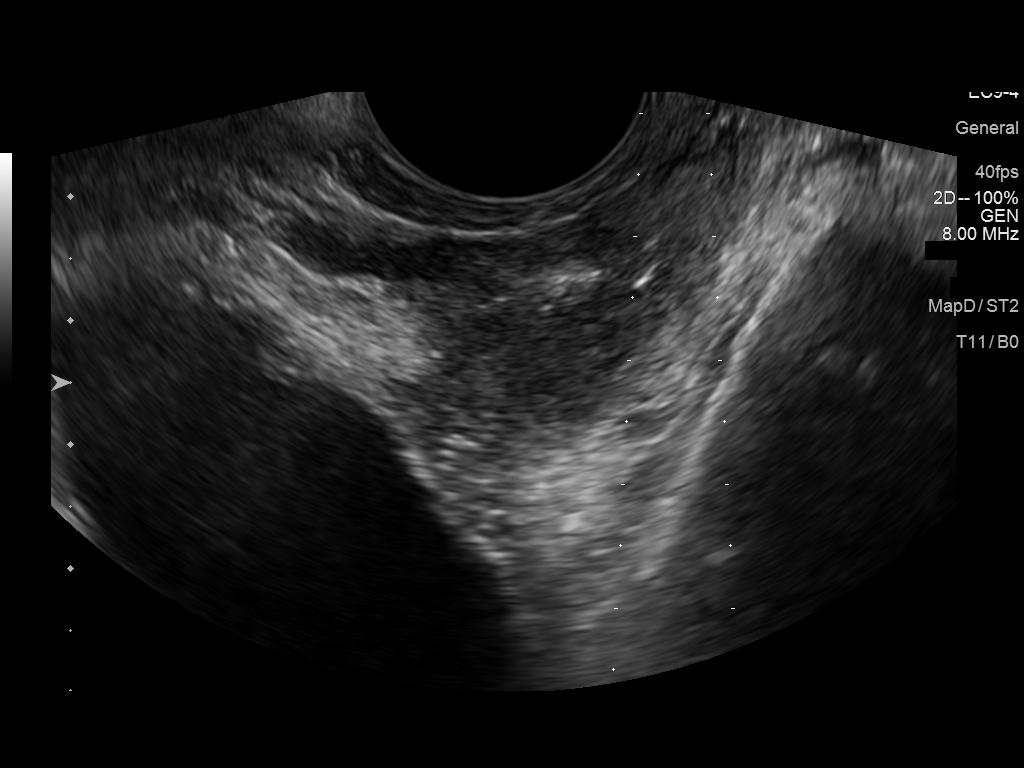
[im 4/5]
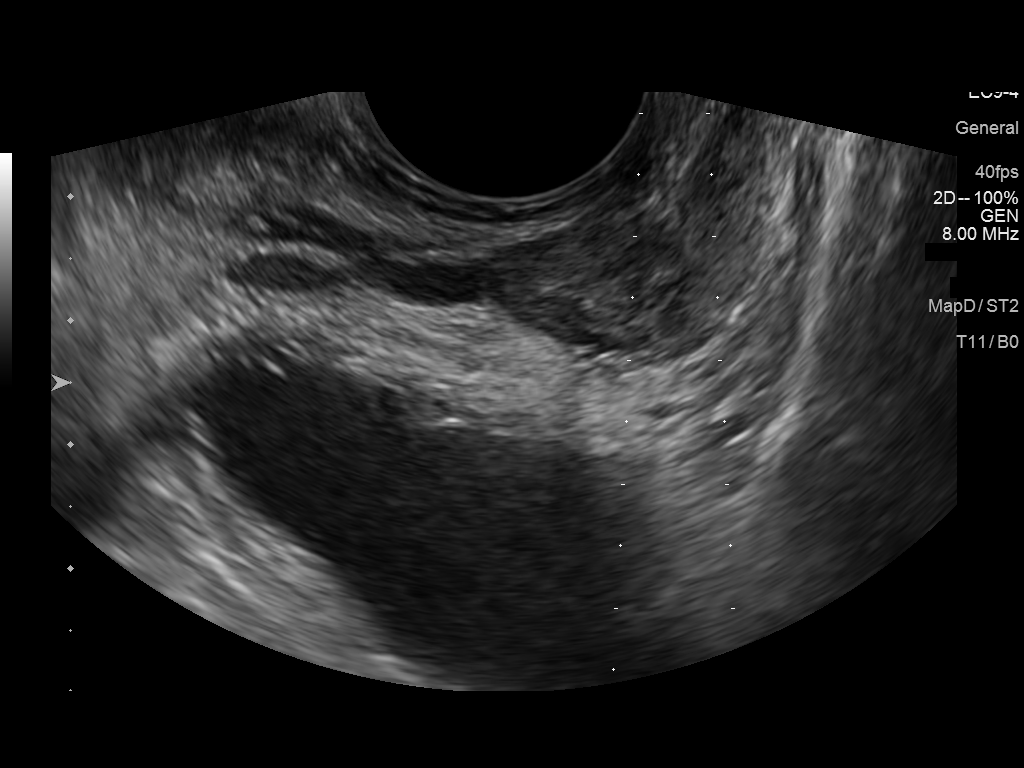
[im 5/5]
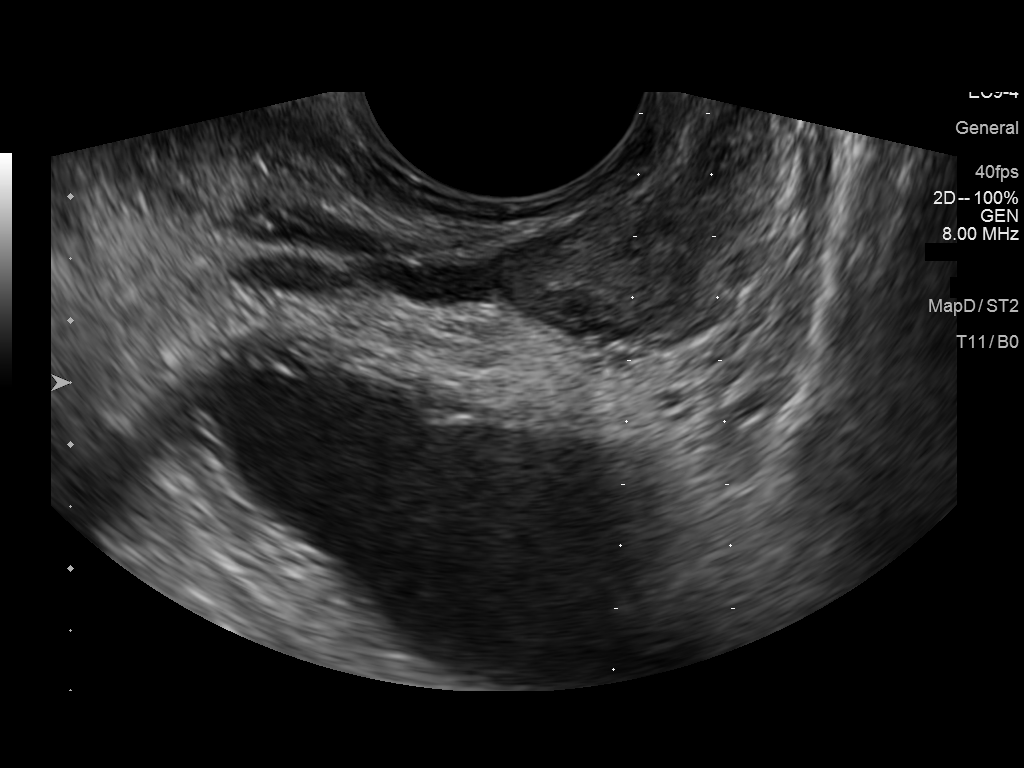

[5 of 5 positions shown; findings below may reference images not displayed]

Canned report from images found in remote index.

Refer to host system for actual result text.

## 2017-11-06 ENCOUNTER — Ambulatory Visit: Payer: Medicare Other | Admitting: Urology

## 2018-01-29 ENCOUNTER — Ambulatory Visit (INDEPENDENT_AMBULATORY_CARE_PROVIDER_SITE_OTHER): Payer: Medicare Other | Admitting: Urology

## 2018-01-29 DIAGNOSIS — C61 Malignant neoplasm of prostate: Secondary | ICD-10-CM

## 2022-06-23 ENCOUNTER — Other Ambulatory Visit: Payer: Self-pay

## 2022-06-23 ENCOUNTER — Encounter (HOSPITAL_COMMUNITY): Payer: Self-pay

## 2022-06-23 ENCOUNTER — Emergency Department (HOSPITAL_COMMUNITY): Payer: Medicare Other

## 2022-06-23 ENCOUNTER — Inpatient Hospital Stay (HOSPITAL_COMMUNITY)
Admission: EM | Admit: 2022-06-23 | Discharge: 2022-06-27 | DRG: 177 | Disposition: A | Discharge status: Skilled Nursing Facility | Payer: Medicare Other | Source: Skilled Nursing Facility | Attending: Internal Medicine | Admitting: Internal Medicine

## 2022-06-23 DIAGNOSIS — I700 Atherosclerosis of aorta: Secondary | ICD-10-CM | POA: Diagnosis present

## 2022-06-23 DIAGNOSIS — N182 Chronic kidney disease, stage 2 (mild): Secondary | ICD-10-CM | POA: Diagnosis present

## 2022-06-23 DIAGNOSIS — F03918 Unspecified dementia, unspecified severity, with other behavioral disturbance: Secondary | ICD-10-CM

## 2022-06-23 DIAGNOSIS — I4891 Unspecified atrial fibrillation: Secondary | ICD-10-CM | POA: Diagnosis present

## 2022-06-23 DIAGNOSIS — Z66 Do not resuscitate: Secondary | ICD-10-CM | POA: Diagnosis present

## 2022-06-23 DIAGNOSIS — Z7901 Long term (current) use of anticoagulants: Secondary | ICD-10-CM

## 2022-06-23 DIAGNOSIS — D509 Iron deficiency anemia, unspecified: Secondary | ICD-10-CM | POA: Diagnosis present

## 2022-06-23 DIAGNOSIS — I472 Ventricular tachycardia, unspecified: Secondary | ICD-10-CM | POA: Diagnosis not present

## 2022-06-23 DIAGNOSIS — J9601 Acute respiratory failure with hypoxia: Secondary | ICD-10-CM | POA: Diagnosis present

## 2022-06-23 DIAGNOSIS — J90 Pleural effusion, not elsewhere classified: Secondary | ICD-10-CM | POA: Diagnosis not present

## 2022-06-23 DIAGNOSIS — R4182 Altered mental status, unspecified: Secondary | ICD-10-CM

## 2022-06-23 DIAGNOSIS — G9341 Metabolic encephalopathy: Secondary | ICD-10-CM | POA: Diagnosis present

## 2022-06-23 DIAGNOSIS — U071 COVID-19: Principal | ICD-10-CM | POA: Diagnosis present

## 2022-06-23 DIAGNOSIS — J8410 Pulmonary fibrosis, unspecified: Secondary | ICD-10-CM | POA: Diagnosis present

## 2022-06-23 DIAGNOSIS — Z8546 Personal history of malignant neoplasm of prostate: Secondary | ICD-10-CM | POA: Diagnosis not present

## 2022-06-23 DIAGNOSIS — I2699 Other pulmonary embolism without acute cor pulmonale: Secondary | ICD-10-CM | POA: Diagnosis present

## 2022-06-23 DIAGNOSIS — E8809 Other disorders of plasma-protein metabolism, not elsewhere classified: Secondary | ICD-10-CM | POA: Diagnosis present

## 2022-06-23 DIAGNOSIS — Z87891 Personal history of nicotine dependence: Secondary | ICD-10-CM | POA: Diagnosis not present

## 2022-06-23 DIAGNOSIS — J810 Acute pulmonary edema: Secondary | ICD-10-CM | POA: Diagnosis not present

## 2022-06-23 DIAGNOSIS — Z6841 Body Mass Index (BMI) 40.0 and over, adult: Secondary | ICD-10-CM | POA: Diagnosis not present

## 2022-06-23 DIAGNOSIS — Z79899 Other long term (current) drug therapy: Secondary | ICD-10-CM | POA: Diagnosis not present

## 2022-06-23 DIAGNOSIS — E46 Unspecified protein-calorie malnutrition: Secondary | ICD-10-CM | POA: Diagnosis not present

## 2022-06-23 DIAGNOSIS — D539 Nutritional anemia, unspecified: Secondary | ICD-10-CM | POA: Diagnosis present

## 2022-06-23 DIAGNOSIS — J1282 Pneumonia due to coronavirus disease 2019: Secondary | ICD-10-CM | POA: Diagnosis present

## 2022-06-23 DIAGNOSIS — I2510 Atherosclerotic heart disease of native coronary artery without angina pectoris: Secondary | ICD-10-CM | POA: Diagnosis present

## 2022-06-23 DIAGNOSIS — R0902 Hypoxemia: Secondary | ICD-10-CM

## 2022-06-23 DIAGNOSIS — T380X5A Adverse effect of glucocorticoids and synthetic analogues, initial encounter: Secondary | ICD-10-CM | POA: Diagnosis present

## 2022-06-23 DIAGNOSIS — I824Z3 Acute embolism and thrombosis of unspecified deep veins of distal lower extremity, bilateral: Secondary | ICD-10-CM | POA: Diagnosis present

## 2022-06-23 DIAGNOSIS — R0689 Other abnormalities of breathing: Secondary | ICD-10-CM

## 2022-06-23 DIAGNOSIS — I2609 Other pulmonary embolism with acute cor pulmonale: Secondary | ICD-10-CM | POA: Diagnosis not present

## 2022-06-23 DIAGNOSIS — J439 Emphysema, unspecified: Secondary | ICD-10-CM | POA: Diagnosis present

## 2022-06-23 DIAGNOSIS — J9602 Acute respiratory failure with hypercapnia: Secondary | ICD-10-CM | POA: Diagnosis present

## 2022-06-23 DIAGNOSIS — F0390 Unspecified dementia without behavioral disturbance: Secondary | ICD-10-CM | POA: Diagnosis present

## 2022-06-23 DIAGNOSIS — J811 Chronic pulmonary edema: Secondary | ICD-10-CM | POA: Insufficient documentation

## 2022-06-23 DIAGNOSIS — I5031 Acute diastolic (congestive) heart failure: Secondary | ICD-10-CM | POA: Diagnosis present

## 2022-06-23 DIAGNOSIS — R0602 Shortness of breath: Secondary | ICD-10-CM | POA: Diagnosis present

## 2022-06-23 DIAGNOSIS — R739 Hyperglycemia, unspecified: Secondary | ICD-10-CM | POA: Diagnosis present

## 2022-06-23 LAB — COMPREHENSIVE METABOLIC PANEL
ALT: 16 U/L (ref 0–44)
AST: 32 U/L (ref 15–41)
Albumin: 2.7 g/dL — ABNORMAL LOW (ref 3.5–5.0)
Alkaline Phosphatase: 73 U/L (ref 38–126)
Anion gap: 9 (ref 5–15)
BUN: 13 mg/dL (ref 8–23)
CO2: 33 mmol/L — ABNORMAL HIGH (ref 22–32)
Calcium: 8.4 mg/dL — ABNORMAL LOW (ref 8.9–10.3)
Chloride: 100 mmol/L (ref 98–111)
Creatinine, Ser: 0.89 mg/dL (ref 0.61–1.24)
GFR, Estimated: >60 mL/min (ref >60)
Glucose, Bld: 105 mg/dL — ABNORMAL HIGH (ref 70–99)
Potassium: 4.8 mmol/L (ref 3.5–5.1)
Sodium: 142 mmol/L (ref 135–145)
Total Bilirubin: 0.9 mg/dL (ref 0.3–1.2)
Total Protein: 7.7 g/dL (ref 6.5–8.1)

## 2022-06-23 LAB — URINALYSIS, ROUTINE W REFLEX MICROSCOPIC
Bacteria, UA: NONE SEEN
Bilirubin Urine: NEGATIVE
Glucose, UA: NEGATIVE mg/dL
Ketones, ur: NEGATIVE mg/dL
Leukocytes,Ua: NEGATIVE
Nitrite: NEGATIVE
Protein, ur: NEGATIVE mg/dL
Specific Gravity, Urine: 1.005 (ref 1.005–1.030)
pH: 5 (ref 5.0–8.0)

## 2022-06-23 LAB — CBC WITH DIFFERENTIAL/PLATELET
Abs Immature Granulocytes: 0.14 10*3/uL — ABNORMAL HIGH (ref 0.00–0.07)
Basophils Absolute: 0.1 10*3/uL (ref 0.0–0.1)
Basophils Relative: 1 %
Eosinophils Absolute: 0.1 10*3/uL (ref 0.0–0.5)
Eosinophils Relative: 2 %
HCT: 39.3 % (ref 39.0–52.0)
Hemoglobin: 11.6 g/dL — ABNORMAL LOW (ref 13.0–17.0)
Immature Granulocytes: 2 %
Lymphocytes Relative: 17 %
Lymphs Abs: 1.4 10*3/uL (ref 0.7–4.0)
MCH: 31.6 pg (ref 26.0–34.0)
MCHC: 29.5 g/dL — ABNORMAL LOW (ref 30.0–36.0)
MCV: 107.1 fL — ABNORMAL HIGH (ref 80.0–100.0)
Monocytes Absolute: 0.8 10*3/uL (ref 0.1–1.0)
Monocytes Relative: 10 %
Neutro Abs: 5.4 10*3/uL (ref 1.7–7.7)
Neutrophils Relative %: 68 %
Platelets: 332 10*3/uL (ref 150–400)
RBC: 3.67 MIL/uL — ABNORMAL LOW (ref 4.22–5.81)
RDW: 17.8 % — ABNORMAL HIGH (ref 11.5–15.5)
WBC: 8 10*3/uL (ref 4.0–10.5)
nRBC: 0.4 % — ABNORMAL HIGH (ref 0.0–0.2)

## 2022-06-23 LAB — BLOOD GAS, VENOUS
Acid-Base Excess: 18.2 mmol/L — ABNORMAL HIGH (ref 0.0–2.0)
Bicarbonate: 48.6 mmol/L — ABNORMAL HIGH (ref 20.0–28.0)
Drawn by: 27160
O2 Saturation: 57.6 %
Patient temperature: 36.7
pCO2, Ven: 87 mmHg (ref 44–60)
pH, Ven: 7.35 (ref 7.25–7.43)
pO2, Ven: 36 mmHg (ref 32–45)

## 2022-06-23 LAB — APTT: aPTT: 34 seconds (ref 24–36)

## 2022-06-23 LAB — RESP PANEL BY RT-PCR (RSV, FLU A&B, COVID)  RVPGX2
Influenza A by PCR: NEGATIVE
Influenza B by PCR: NEGATIVE
Resp Syncytial Virus by PCR: NEGATIVE
SARS Coronavirus 2 by RT PCR: POSITIVE — AB

## 2022-06-23 LAB — BRAIN NATRIURETIC PEPTIDE: B Natriuretic Peptide: 90 pg/mL (ref 0.0–100.0)

## 2022-06-23 LAB — PROTIME-INR
INR: 1.3 — ABNORMAL HIGH (ref 0.8–1.2)
Prothrombin Time: 15.9 seconds — ABNORMAL HIGH (ref 11.4–15.2)

## 2022-06-23 LAB — LACTIC ACID, PLASMA
Lactic Acid, Venous: 1.7 mmol/L (ref 0.5–1.9)
Lactic Acid, Venous: 1.9 mmol/L (ref 0.5–1.9)

## 2022-06-23 MED ORDER — IOHEXOL 350 MG/ML SOLN
100.0000 mL | Freq: Once | INTRAVENOUS | Status: AC | PRN
  Administered 2022-06-23: 100 mL via INTRAVENOUS

## 2022-06-23 MED ORDER — INSULIN ASPART 100 UNIT/ML IJ SOLN: 0.0000 [IU] | Freq: Three times a day (TID) | INTRAMUSCULAR | Status: DC

## 2022-06-23 MED ORDER — SODIUM CHLORIDE 0.9 % IV SOLN
500.0000 mg | INTRAVENOUS | Status: AC
  Administered 2022-06-23: 500 mg via INTRAVENOUS
  Filled 2022-06-23: qty 5

## 2022-06-23 MED ORDER — METHYLPREDNISOLONE SODIUM SUCC 125 MG IJ SOLR: 125.0000 mg | Freq: Every day | INTRAMUSCULAR | Status: DC

## 2022-06-23 MED ORDER — ALBUTEROL SULFATE HFA 108 (90 BASE) MCG/ACT IN AERS: 2.0000 | INHALATION_SPRAY | Freq: Four times a day (QID) | RESPIRATORY_TRACT | Status: DC

## 2022-06-23 MED ORDER — SODIUM CHLORIDE 0.9 % IV SOLN
2.0000 g | Freq: Once | INTRAVENOUS | Status: AC
  Administered 2022-06-23: 2 g via INTRAVENOUS
  Filled 2022-06-23: qty 12.5

## 2022-06-23 MED ORDER — VANCOMYCIN HCL 2000 MG/400ML IV SOLN
2000.0000 mg | Freq: Once | INTRAVENOUS | Status: AC
  Administered 2022-06-23: 2000 mg via INTRAVENOUS
  Filled 2022-06-23: qty 400

## 2022-06-23 MED ORDER — PANTOPRAZOLE SODIUM 40 MG IV SOLR
40.0000 mg | Freq: Every day | INTRAVENOUS | Status: DC
  Administered 2022-06-24 – 2022-06-26 (×4): 40 mg via INTRAVENOUS
  Filled 2022-06-23 (×4): qty 10

## 2022-06-23 MED ORDER — NIRMATRELVIR/RITONAVIR (PAXLOVID)TABLET
3.0000 | ORAL_TABLET | Freq: Two times a day (BID) | ORAL | Status: DC
  Filled 2022-06-23: qty 30

## 2022-06-23 MED ORDER — HEPARIN (PORCINE) 25000 UT/250ML-% IV SOLN
1500.0000 [IU]/h | INTRAVENOUS | Status: DC
  Administered 2022-06-23: 1500 [IU]/h via INTRAVENOUS
  Filled 2022-06-23: qty 250

## 2022-06-23 MED ORDER — ONDANSETRON HCL 4 MG/2ML IJ SOLN: 4.0000 mg | Freq: Four times a day (QID) | INTRAMUSCULAR | Status: DC | PRN

## 2022-06-23 MED ORDER — HEPARIN BOLUS VIA INFUSION
5000.0000 [IU] | Freq: Once | INTRAVENOUS | Status: AC
  Administered 2022-06-23: 5000 [IU] via INTRAVENOUS

## 2022-06-23 MED ORDER — DEXAMETHASONE SODIUM PHOSPHATE 10 MG/ML IJ SOLN
6.0000 mg | Freq: Once | INTRAMUSCULAR | Status: AC
  Administered 2022-06-23: 6 mg via INTRAVENOUS
  Filled 2022-06-23: qty 1

## 2022-06-23 MED ORDER — DONEPEZIL HCL 5 MG PO TABS
10.0000 mg | ORAL_TABLET | Freq: Every day | ORAL | Status: DC
  Administered 2022-06-24 – 2022-06-26 (×3): 10 mg via ORAL
  Filled 2022-06-23 (×3): qty 2

## 2022-06-23 MED ORDER — ONDANSETRON HCL 4 MG PO TABS: 4.0000 mg | ORAL_TABLET | Freq: Four times a day (QID) | ORAL | Status: DC | PRN

## 2022-06-23 MED ORDER — LORAZEPAM 2 MG/ML IJ SOLN
1.0000 mg | Freq: Once | INTRAMUSCULAR | Status: AC
  Administered 2022-06-23: 1 mg via INTRAVENOUS
  Filled 2022-06-23: qty 1

## 2022-06-23 MED ORDER — VANCOMYCIN HCL 1500 MG/300ML IV SOLN
1500.0000 mg | INTRAVENOUS | Status: DC
  Administered 2022-06-24: 1500 mg via INTRAVENOUS
  Filled 2022-06-23: qty 300

## 2022-06-23 MED ORDER — ZINC SULFATE 220 (50 ZN) MG PO CAPS: 220.0000 mg | ORAL_CAPSULE | Freq: Every day | ORAL | Status: DC

## 2022-06-23 MED ORDER — PREDNISONE 50 MG PO TABS: 50.0000 mg | ORAL_TABLET | Freq: Every day | ORAL | Status: DC

## 2022-06-23 MED ORDER — VITAMIN C 500 MG PO TABS: 500.0000 mg | ORAL_TABLET | Freq: Every day | ORAL | Status: DC

## 2022-06-23 MED ORDER — FUROSEMIDE 10 MG/ML IJ SOLN
60.0000 mg | INTRAMUSCULAR | Status: AC
  Administered 2022-06-23: 60 mg via INTRAVENOUS
  Filled 2022-06-23: qty 6

## 2022-06-23 NOTE — H&P (Incomplete)
History and Physical    Patient: Bradley Roman WLS:[id number] DOB: 1940/09/02 DOA: 06/23/2022 DOS: the patient was seen and examined on 06/23/2022 PCP: Jani Gravel, MD  Patient coming from: SNF  Chief Complaint:  Chief Complaint  Patient presents with  . Shortness of Breath   HPI: Bradley Roman is a 81 y.o. male with medical history significant of dementia who presents to the emergency department via EMS due to shortness of breath.  Patient was diagnosed with pneumonia about 3 weeks ago and patient was on supplemental oxygen via NRB at 4 PM.  Patient was unable to provide history, history was obtained from ED physician and ED medical record.  Per report, patient complained of chest congestion and was verbally not responding, O2 sat checked was 76%, EMS was activated and on arrival of EMS team, NRB was readjusted with improved O2 sats, patient was taken to the ED for further evaluation and management.  ED Course:  In the emergency department, temperature on arrival was 97.5 F, respiratory rate 25/min, HR was 75 bpm, BP was 153/120, patient was transition from NRB to BiPAP at FiO2 of 50% with an O2 sat of 100%.  Workup in the ED showed macrocytic anemia.  BMP was normal except for bicarb of 33 and blood glucose of 105.  Albumin 2.7.  VBG done showed pCO2 of 87% which resulted in patient being transitioned to BiPAP.  Urinalysis was unimpressive for UTI.  Lactic acid 1.7  > 1.9, BNP 19.0.  Influenza A, B was negative, SARS coronavirus 2 was positive.  Blood culture pending CT head without contrast showed no acute intracranial abnormality. CT angiography chest with contrast showed:  Nonocclusive distal right main and proximal segmental right lower and middle lobe pulmonary emboli.  Associated enlarged right to left ventricular ratio of 1.2. No pulmonary infarction. 2. Cardiomegaly with mild pulmonary edema and bilateral trace pleural effusions. 3. Bilateral basilar lower lobe findings suggestive  of pulmonary fibrosis/possible UIP. Consider nonemergent outpatient high-resolution chest CT for further evaluation. 4. Aortic Atherosclerosis (ICD10-I70.0) including four-vessel coronary calcifications. 5.  Emphysema Patient was empirically started on IV cefepime and azithromycin due to initial presumption of CAP POA.  Decadron was given and patient was started on Paxlovid.  IV Lasix was given due to pulmonary edema and bilateral pleural effusion.  Ativan was given and patient was started on heparin drip.  Hospitalist was asked to admit patient for further evaluation and management.  Review of Systems: Review of systems as noted in the HPI. All other systems reviewed and are negative.   Past Medical History:  Diagnosis Date  . Cancer ([place])   . Dementia [facility])    Past Surgical History:  Procedure Laterality Date  . INTRAMEDULLARY (IM) NAIL INTERTROCHANTERIC Left 07/24/2015   Procedure: INTRAMEDULLARY (IM) NAIL INTERTROCHANTRIC AFFIXUS NAIL;  Surgeon: Wylene Simmer, MD;  Location: [employer];  Service: Orthopedics;  Laterality: Left;    Social History:  reports that he has quit smoking. He does not have any smokeless tobacco history on file. He reports that he does not drink alcohol. No history on file for drug use.   No Known Allergies  History reviewed. No pertinent family history.  ***  Prior to Admission medications   Medication Sig Start Date End Date Taking? Authorizing Provider  acetaminophen (TYLENOL) 325 MG tablet Take 2 tablets (650 mg total) by mouth every 6 (six) hours as needed for mild pain (or Fever >/= 101). 07/27/15   Delfina Redwood, MD  donepezil (ARICEPT) 10 MG tablet Take 1 tablet by mouth at bedtime. 06/27/15   [provider]  enoxaparin (LOVENOX) 40 MG/0.4ML injection Inject 0.4 mLs (40 mg total) into the skin daily. For 2 weeks Patient not taking: Reported on 01/14/2016 07/27/15 08/10/15  Corky Sing, PA-C  HYDROcodone-acetaminophen (NORCO/VICODIN)  5-325 MG tablet Take 1 tablet by mouth every 6 (six) hours as needed for severe pain (breakthrough pain). Patient not taking: Reported on 01/14/2016 07/27/15   Corky Sing, PA-C  senna-docusate (SENOKOT-S) 8.6-50 MG tablet Take 2 tablets by mouth daily. Patient not taking: Reported on 01/14/2016 07/27/15   Delfina Redwood, MD    Physical Exam: BP (!) 136/55   Pulse 80   Temp (!) 97 F (36.1 C) (Axillary)   Resp (!) 22   Ht '5\' 6"'$  (1.676 m)   Wt 113.4 kg   SpO2 94%   BMI 40.35 kg/m   General: 81 y.o. year-old male well developed well nourished in no acute distress.  Alert and oriented x3. HEENT: NCAT, EOMI Neck: Supple, trachea medial Cardiovascular: Regular rate and rhythm with no rubs or gallops.  No thyromegaly or JVD noted.  No lower extremity edema. 2/4 pulses in all 4 extremities. Respiratory: Clear to auscultation with no wheezes or rales. Good inspiratory effort. Abdomen: Soft, nontender nondistended with normal bowel sounds x4 quadrants. Muskuloskeletal: No cyanosis, clubbing or edema noted bilaterally Neuro: CN II-XII intact, strength 5/5 x 4, sensation, reflexes intact Skin: No ulcerative lesions noted or rashes Psychiatry: Judgement and insight appear normal. Mood is appropriate for condition and setting          Labs on Admission:  Basic Metabolic Panel: Recent Labs  Lab 06/23/22 1240  NA 142  K 4.8  CL 100  CO2 33*  GLUCOSE 105*  BUN 13  CREATININE 0.89  CALCIUM 8.4*   Liver Function Tests: Recent Labs  Lab 06/23/22 1240  AST 32  ALT 16  ALKPHOS 73  BILITOT 0.9  PROT 7.7  ALBUMIN 2.7*   No results for input(s): "LIPASE", "AMYLASE" in the last 168 hours. No results for input(s): "AMMONIA" in the last 168 hours. CBC: Recent Labs  Lab 06/23/22 1240  WBC 8.0  NEUTROABS 5.4  HGB 11.6*  HCT 39.3  MCV 107.1*  PLT 332   Cardiac Enzymes: No results for input(s): "CKTOTAL", "CKMB", "CKMBINDEX", "TROPONINI" in the last 168 hours.  BNP  (last 3 results) Recent Labs    06/23/22 1549  BNP 90.0    ProBNP (last 3 results) No results for input(s): "PROBNP" in the last 8760 hours.  CBG: No results for input(s): "GLUCAP" in the last 168 hours.  Radiological Exams on Admission: CT Head Wo Contrast  Result Date: 06/23/2022 CLINICAL DATA:  AMS EXAM: CT HEAD WITHOUT CONTRAST TECHNIQUE: Contiguous axial images were obtained from the base of the skull through the vertex without intravenous contrast. RADIATION DOSE REDUCTION: This exam was performed according to the departmental dose-optimization program which includes automated exposure control, adjustment of the mA and/or kV according to patient size and/or use of iterative reconstruction technique. COMPARISON:  CT head 11/30/2014 FINDINGS: Brain: Cerebral ventricle sizes are concordant with the degree of cerebral volume loss. Patchy and confluent areas of decreased attenuation are noted throughout the deep and periventricular white matter of the cerebral hemispheres bilaterally, compatible with chronic microvascular ischemic disease. no evidence of large-territorial acute infarction. No parenchymal hemorrhage. No mass lesion. No extra-axial collection. No mass effect or midline shift. No  hydrocephalus. Basilar cisterns are patent. Vascular: No hyperdense vessel. Skull: No acute fracture or focal lesion. Sinuses/Orbits: Paranasal sinuses and mastoid air cells are clear. Bilateral lens replacement. Otherwise the orbits are unremarkable. Other: None. IMPRESSION: No acute intracranial abnormality. Electronically Signed   By: Iven Finn M.D.   On: 06/23/2022 17:13   CT Angio Chest PE W and/or Wo Contrast  Result Date: 06/23/2022 CLINICAL DATA:  cf PE vs PNA EXAM: CT ANGIOGRAPHY CHEST WITH CONTRAST TECHNIQUE: Multidetector CT imaging of the chest was performed using the standard protocol during bolus administration of intravenous contrast. Multiplanar CT image reconstructions and MIPs  were obtained to evaluate the vascular anatomy. RADIATION DOSE REDUCTION: This exam was performed according to the departmental dose-optimization program which includes automated exposure control, adjustment of the mA and/or kV according to patient size and/or use of iterative reconstruction technique. CONTRAST:  155m OMNIPAQUE IOHEXOL 350 MG/ML SOLN COMPARISON:  None Available. FINDINGS: Cardiovascular: Satisfactory opacification of the pulmonary arteries to the segmental level. Nonocclusive filling defect of the most distal right pulmonary artery with extension into the proximal segmental right lower and right middle lobe pulmonary arteries. Enlarged right atria. Enlarged left atrium. Associated enlarged right to left ventricular ratio of 1. The main pulmonary artery is normal in caliber. No significant pericardial effusion. The thoracic aorta is normal in caliber. Moderate to severe atherosclerotic plaque of the thoracic aorta. Four-vessel coronary artery calcifications. Mediastinum/Nodes: No enlarged mediastinal, hilar, or axillary lymph nodes. Thyroid gland, trachea, and esophagus demonstrate no significant findings. Lungs/Pleura: Respiratory motion artifact with limited evaluation. Mild paraseptal emphysematous changes. Mild interlobular septal wall thickening. Bilateral basilar lower lobe, right greater than left, honeycombing. No focal consolidation. No pulmonary nodule. No pulmonary mass. Trace bilateral pleural effusions. No pneumothorax. Upper Abdomen: No acute abnormality. Musculoskeletal: No chest wall abnormality. No suspicious lytic or blastic osseous lesions. No acute displaced fracture. Multilevel moderate degenerative changes of the spine. Review of the MIP images confirms the above findings. IMPRESSION: 1. Nonocclusive distal right main and proximal segmental right lower and middle lobe pulmonary emboli. Associated enlarged right to left ventricular ratio of 1.2. No pulmonary infarction. 2.  Cardiomegaly with mild pulmonary edema and bilateral trace pleural effusions. 3. 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 Consider nonemergent outpatient high-resolution chest CT for further evaluation. 4. Aortic Atherosclerosis (ICD10-I70.0) including four-vessel coronary calcifications. 5.  Emphysema (ICD10-J43.9). These results were called by telephone at the time of interpretation on 06/23/2022 at 5:07 pm to provider RMargaretmary Eddy, who verbally acknowledged these results. Electronically Signed   By: MIven FinnM.D.   On: 06/23/2022 17:11   DG Chest Port 1 View  Result Date: 06/23/2022 CLINICAL DATA:  Shortness of breath. Diagnosed with pneumonia 3 weeks ago. Congestion. EXAM: PORTABLE CHEST 1 VIEW COMPARISON:  AP chest 07/24/2015; CT chest 12/28/2009 FINDINGS: There are moderately to markedly decreased lung volumes, limiting evaluation. The cardiac silhouette may be mildly enlarged. Mediastinal contours are grossly within normal limits for low lung volumes. Moderate calcifications within the aortic arch. Moderate bilateral interstitial thickening appears unchanged from 07/24/2015 prior radiographs, especially given the lower lung volumes on the current exam. There is bibasilar bronchovascular crowding. Evaluation of the lung bases is limited. No large pleural effusion is seen. No pneumothorax. 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 IMPRESSION: 1. Moderately to markedly decreased lung volumes, limiting evaluation. 2. Moderate bilateral interstitial thickening appears not significantly changed from 07/24/2015, especially given the lower lung volumes on the current  exam. This is favored represent chronic interstitial lung disease. Electronically Signed   By: Yvonne Kendall M.D.   On: 06/23/2022 14:31    EKG: I independently viewed the EKG done and my findings are as followed: Atrial fibrillation with rate  control  Assessment/Plan Present on Admission: . COVID-19 virus infection  Principal Problem:   COVID-19 virus infection  COVID-19 virus infection Continue albuterol q.6h Continue Solu-Medrol continue IV Remdesivir per pharmacy protocol Continue vitamin-C 500 mg p.o. Daily Continue zinc 220 mg p.o. Daily Continue Mucinex, Robitussin and Tussionex Continue Tylenol p.r.n. for fever Continue supplemental oxygen to maintain O2 sat > or = 94% with plan to wean patient off supplemental oxygen as tolerated (of note, patient does not use oxygen at baseline) Continue incentive spirometry and flutter valve q2mn as tolerated Encourage proning, early ambulation, and side laying as tolerated Continue airborne isolation precaution Inflammatory markers: LDH:  *** (normal range 313-618) CRP:  *** (normal range < 10.0) D-dimer: *** (Normal range 11.1-264) Ferritin: ***(6.2-137) Continue monitoring daily inflammatory markers Physician PPE:  Surgical mask with face shield, N-95, nonsterile gloves, disposable gown, head and shoe cover s Patient PPE:  Face mask      Acute pulmonary embolus Pulmonary edema and bilateral pleural effusions, rule out CHF Acute respiratory failure with hypoxia and hypercapnia Macrocytic anemia Iron deficiency anemia Pulmonary fibrosis Hypoalbuminemia possibly secondary to moderate protein calorie malnutrition  DVT prophylaxis: ***   Code Status: ***   Family Communication: ***   Disposition Plan: ***   Consults called: ***   Admission status: ***     OBernadette HoitMD [facility] Pager [phone] If 7PM-7AM, please contact night-coverage [email or web address] Password [email or web address] 06/23/2022, 10:19 PM    Review of Systems: {ROS_Text:26778} Past Medical History:  Diagnosis Date  . [place]   . [facility] [place]    Past Surgical History:  Procedure Laterality Date  . INTRAMEDULLARY (IM) NAIL INTERTROCHANTERIC Left 07/24/2015   Procedure:  INTRAMEDULLARY (IM) NAIL INTERTROCHANTRIC AFFIXUS NAIL;  Surgeon: JWylene Simmer MD;  [place]  Service: Orthopedics;  Laterality: Left;   Social History:  reports that he has quit smoking. He does not have any smokeless tobacco history on file. He reports that he does not drink alcohol. No history on file for drug use.  No Known Allergies  History reviewed. No pertinent family history.  Prior to Admission medications   Medication Sig Start Date End Date Taking? Authorizing Provider  acetaminophen (TYLENOL) 325 MG tablet Take 2 tablets (650 mg total) by mouth every 6 (six) hours as needed for mild pain (or Fever >/= 101). 07/27/15   SDelfina Redwood MD  donepezil (ARICEPT) 10 MG tablet Take 1 tablet by mouth at bedtime. 06/27/15   [provider]  enoxaparin (LOVENOX) 40 MG/0.4ML injection Inject 0.4 mLs (40 mg total) into the skin daily. For 2 weeks Patient not taking: Reported on 01/14/2016 07/27/15 08/10/15  OCorky Sing PA-C  HYDROcodone-acetaminophen (NORCO/VICODIN) 5-325 MG tablet Take 1 tablet by mouth every 6 (six) hours as needed for severe pain (breakthrough pain). Patient not taking: Reported on 01/14/2016 07/27/15   OCorky Sing PA-C  senna-docusate (SENOKOT-S) 8.6-50 MG tablet Take 2 tablets by mouth daily. Patient not taking: Reported on 01/14/2016 07/27/15   SDelfina Redwood MD    Physical Exam: Vitals:   06/23/22 2000 06/23/22 2030 06/23/22 2100 06/23/22 2130  BP: 109/83 (!) 119/95 (!) 114/49 (!) 136/55  Pulse: 74 64 83 80  Resp: (Marland Kitchen  39 (!) 22 (!) 27 (!) 22  Temp:      TempSrc:      SpO2: 97% 93% 94% 94%  Weight:      Height:       *** Data Reviewed: {Tip this will not be part of the note when signed- Document your independent interpretation of telemetry tracing, EKG, lab, Radiology test or any other diagnostic tests. Add any new diagnostic test ordered today. (Optional):26781} {Results:26384}  Assessment and Plan: No notes have been  filed under this hospital service. Service: Hospitalist     Advance Care Planning:   Code Status: DNR ***  Consults: ***  Family Communication: ***  Severity of Illness: {Observation/Inpatient:21159}  Author: Bernadette Hoit, DO 06/23/2022 10:01 PM  For on call review [email or web address].

## 2022-06-23 NOTE — ED Provider Notes (Addendum)
[facility] EMERGENCY DEPARTMENT Provider Note   CSN: [id number] Arrival date & time: 06/23/22  1324     History  Chief Complaint  Patient presents with   Shortness of Breath    Bradley Roman is a 81 y.o. male.  81 year old male with a history of Roman, Bradley Roman, Bradley Roman, Bradley Roman status.  He was brought in by EMS from his facility who reports that he was having shortness of breath and congestion.  Says that he was found to have oxygen saturations of 76% upon EMS arrival to his facility.  They also reported that he was confused.  Patient unable to provide additional history.  Discussed with his daughter Bradley Roman who states that patient is typically alert and oriented to self but not place or time.  She did not have additional history to provide about current illness.  Was also recently treated for pneumonia on Augmentin.   Past Medical History:  Diagnosis Date   Roman (HCC)    Roman (HCC)       Home Medications Prior to Admission medications   Medication Sig Start Date End Date Taking? Authorizing Provider  acetaminophen (TYLENOL) 325 MG tablet Take 2 tablets (650 mg total) by mouth every 6 (six) hours as needed for mild pain (or Fever >/= 101). 07/27/15   Christiane Ha, MD  donepezil (ARICEPT) 10 MG tablet Take 1 tablet by mouth at bedtime. 06/27/15   [provider]  enoxaparin (LOVENOX) 40 MG/0.4ML injection Inject 0.4 mLs (40 mg total) into the skin daily. For 2 weeks Patient not taking: Reported on 01/14/2016 07/27/15 08/10/15  Jacinta Shoe, PA-C  HYDROcodone-acetaminophen (NORCO/VICODIN) 5-325 MG tablet Take 1 tablet by mouth every 6 (six) hours as needed for severe pain (breakthrough pain). Patient not taking: Reported on 01/14/2016 07/27/15   Jacinta Shoe, PA-C  senna-docusate (SENOKOT-S) 8.6-50 MG tablet Take 2 tablets by mouth daily. Patient  not taking: Reported on 01/14/2016 07/27/15   Christiane Ha, MD      Allergies    Patient has no known allergies.    Review of Systems   Review of Systems  Physical Exam Updated Vital Signs BP 109/79 (BP Location: Right Arm)   Pulse 69   Temp (!) 97 F (36.1 C) (Axillary)   Resp 20   Ht 5\' 6"  (1.676 m)   Wt 113.4 kg   SpO2 94%   BMI 40.35 kg/m  Physical Exam Vitals and nursing note reviewed.  Constitutional:      General: He is in acute distress.     Appearance: He is well-developed. He is ill-appearing.     Comments: Tachypneic.  Responds in short phrases but unable to provide additional history.  Oriented only to self.  HENT:     Head: Normocephalic and atraumatic.     Right Ear: External ear normal.     Left Ear: External ear normal.     Nose: Nose normal.  Eyes:     Extraocular Movements: Extraocular movements intact.     Conjunctiva/sclera: Conjunctivae normal.     Pupils: Pupils are equal, round, and reactive to light.  Cardiovascular:     Rate and Rhythm: Normal rate and regular rhythm.     Heart sounds: Normal heart sounds.  Pulmonary:     Effort: Respiratory distress present.     Breath sounds: Rales (Bibasilar) present.  Musculoskeletal:  Cervical back: Normal range of motion and neck supple.     Right lower leg: Edema present.     Left lower leg: Edema present.  Skin:    General: Skin is warm and dry.  Neurological:     Roman Status: He is alert. Roman status is at baseline.  Psychiatric:        Mood and Affect: Mood normal.        Behavior: Behavior normal.     ED Results / Procedures / Treatments   Labs (all labs ordered are listed, but only abnormal results are displayed) Labs Reviewed  RESP PANEL BY RT-PCR (RSV, FLU A&B, COVID)  RVPGX2 - Abnormal; Notable for the following components:      Result Value   SARS Coronavirus 2 by RT PCR POSITIVE (*)    All other components within normal limits  COMPREHENSIVE METABOLIC PANEL -  Abnormal; Notable for the following components:   CO2 33 (*)    Glucose, Bld 105 (*)    Calcium 8.4 (*)    Albumin 2.7 (*)    All other components within normal limits  CBC WITH DIFFERENTIAL/PLATELET - Abnormal; Notable for the following components:   RBC 3.67 (*)    Hemoglobin 11.6 (*)    MCV 107.1 (*)    MCHC 29.5 (*)    RDW 17.8 (*)    nRBC 0.4 (*)    Abs Immature Granulocytes 0.14 (*)    All other components within normal limits  PROTIME-INR - Abnormal; Notable for the following components:   Prothrombin Time 15.9 (*)    INR 1.3 (*)    All other components within normal limits  URINALYSIS, ROUTINE W REFLEX MICROSCOPIC - Abnormal; Notable for the following components:   Color, Urine COLORLESS (*)    Hgb urine dipstick SMALL (*)    All other components within normal limits  BLOOD GAS, VENOUS - Abnormal; Notable for the following components:   pCO2, Ven 87 (*)    Bicarbonate 48.6 (*)    Acid-Base Excess 18.2 (*)    All other components within normal limits  CULTURE, BLOOD (ROUTINE X 2)  CULTURE, BLOOD (ROUTINE X 2)  MRSA NEXT GEN BY PCR, NASAL  LACTIC ACID, PLASMA  LACTIC ACID, PLASMA  BRAIN NATRIURETIC PEPTIDE  APTT  HEPARIN LEVEL (UNFRACTIONATED)  CBC  CBC WITH DIFFERENTIAL/PLATELET  COMPREHENSIVE METABOLIC PANEL  C-REACTIVE PROTEIN  D-DIMER, QUANTITATIVE  FERRITIN  MAGNESIUM  PHOSPHORUS  FOLATE  VITAMIN B12    EKG EKG Interpretation  Date/Time:  Friday June 23 2022 13:34:04 EST Ventricular Rate:  84 PR Interval:    QRS Duration: 101 QT Interval:  348 QTC Calculation: 394 R Axis:   63 Text Interpretation: Interpretation limited secondary to artifact Atrial fibrillation Low voltage, precordial leads Abnormal R-wave progression, early transition Abnormal T, consider ischemia, lateral leads Baseline wander in lead(s) V4 Confirmed by Vonita Moss [phone]) on 06/23/2022 3:02:53 PM  Radiology CT Head Wo Contrast  Result Date: 06/23/2022 CLINICAL  DATA:  AMS EXAM: CT HEAD WITHOUT CONTRAST TECHNIQUE: Contiguous axial images were obtained from the base of the skull through the vertex without intravenous contrast. RADIATION DOSE REDUCTION: This exam was performed according to the departmental dose-optimization program which includes automated exposure control, adjustment of the mA and/or kV according to patient size and/or use of iterative reconstruction technique. COMPARISON:  CT head 11/30/2014 FINDINGS: Brain: Cerebral ventricle sizes are concordant with the degree of cerebral volume loss. Patchy and confluent areas of decreased attenuation  are noted throughout the deep and periventricular white matter of the cerebral hemispheres bilaterally, compatible with chronic microvascular ischemic disease. no evidence of large-territorial acute infarction. No parenchymal hemorrhage. No mass lesion. No extra-axial collection. No mass effect or midline shift. No hydrocephalus. Basilar cisterns are patent. Vascular: No hyperdense vessel. Skull: No acute fracture or focal lesion. Sinuses/Orbits: Paranasal sinuses and mastoid air cells are clear. Bilateral lens replacement. Otherwise the orbits are unremarkable. Other: None. IMPRESSION: No acute intracranial abnormality. Electronically Signed   By: Tish Frederickson M.D.   On: 06/23/2022 17:13   CT Angio Chest PE W and/or Wo Contrast  Result Date: 06/23/2022 CLINICAL DATA:  cf PE vs PNA EXAM: CT ANGIOGRAPHY CHEST WITH CONTRAST TECHNIQUE: Multidetector CT imaging of the chest was performed using the standard protocol during bolus administration of intravenous contrast. Multiplanar CT image reconstructions and MIPs were obtained to evaluate the vascular anatomy. RADIATION DOSE REDUCTION: This exam was performed according to the departmental dose-optimization program which includes automated exposure control, adjustment of the mA and/or kV according to patient size and/or use of iterative reconstruction technique.  CONTRAST:  OMNIPAQUE IOHEXOL 350 MG/ML SOLN COMPARISON:  None Available. FINDINGS: Cardiovascular: Satisfactory opacification of the pulmonary arteries to the segmental level. Nonocclusive filling defect of the most distal right pulmonary artery with extension into the proximal segmental right lower and right middle lobe pulmonary arteries. Enlarged right atria. Enlarged left atrium. Associated enlarged right to left ventricular ratio of 1. The main pulmonary artery is normal in caliber. No significant pericardial effusion. The thoracic aorta is normal in caliber. Moderate to severe atherosclerotic plaque of the thoracic aorta. Four-vessel coronary artery calcifications. Mediastinum/Nodes: No enlarged mediastinal, hilar, or axillary lymph nodes. Thyroid gland, trachea, and esophagus demonstrate no significant findings. Lungs/Pleura: Respiratory motion artifact with limited evaluation. Mild paraseptal emphysematous changes. Mild interlobular septal wall thickening. Bilateral basilar lower lobe, right greater than left, honeycombing. No focal consolidation. No pulmonary nodule. No pulmonary mass. Trace bilateral pleural effusions. No pneumothorax. Upper Abdomen: No acute abnormality. Musculoskeletal: No chest wall abnormality. No suspicious lytic or blastic osseous lesions. No acute displaced fracture. Multilevel moderate degenerative changes of the spine. Review of the MIP images confirms the above findings. IMPRESSION: 1. Nonocclusive distal right main and proximal segmental right lower and middle lobe pulmonary emboli. Associated enlarged right to left ventricular ratio of 1.2. No pulmonary infarction. 2. Cardiomegaly with mild pulmonary edema and bilateral trace pleural effusions. 3. 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 Consider nonemergent outpatient high-resolution chest CT for further evaluation. 4. Aortic Atherosclerosis (ICD10-I70.0) including four-vessel  coronary calcifications. 5.  Emphysema (ICD10-J43.9). These results were called by telephone at the time of interpretation on 06/23/2022 at 5:07 pm to provider Vonita Moss , who verbally acknowledged these results. Electronically Signed   By: Tish Frederickson M.D.   On: 06/23/2022 17:11   DG Chest Port 1 View  Result Date: 06/23/2022 CLINICAL DATA:  Shortness of breath. Diagnosed with pneumonia 3 weeks ago. Congestion. EXAM: PORTABLE CHEST 1 VIEW COMPARISON:  AP chest 07/24/2015; CT chest 12/28/2009 FINDINGS: There are moderately to markedly decreased lung volumes, limiting evaluation. The cardiac silhouette may be mildly enlarged. Mediastinal contours are grossly within normal limits for low lung volumes. Moderate calcifications within the aortic arch. Moderate bilateral interstitial thickening appears unchanged from 07/24/2015 prior radiographs, especially given the lower lung volumes on the current exam. There is bibasilar bronchovascular crowding. Evaluation of the lung bases is limited. No large pleural effusion  is seen. No pneumothorax. 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 IMPRESSION: 1. Moderately to markedly decreased lung volumes, limiting evaluation. 2. Moderate bilateral interstitial thickening appears not significantly changed from 07/24/2015, especially given the lower lung volumes on the current exam. This is favored represent chronic interstitial lung disease. Electronically Signed   By: Neita Garnet M.D.   On: 06/23/2022 14:31    Procedures Procedures   Medications Ordered in ED Medications  vancomycin (VANCOREADY) IVPB 1500 mg/300 mL (has no administration in time range)  heparin ADULT infusion 100 units/mL (25000 units/270mL) (1,500 Units/hr Intravenous New Bag/Given 06/23/22 1957)  donepezil (ARICEPT) tablet 10 mg (10 mg Oral Not Given 06/24/22 0006)  albuterol (VENTOLIN HFA) 108 (90 Base) MCG/ACT inhaler 2 puff (has no administration  in time range)  insulin aspart (novoLOG) injection 0-15 Units (has no administration in time range)  ascorbic acid (VITAMIN C) tablet 500 mg (has no administration in time range)  zinc sulfate capsule 220 mg (has no administration in time range)  pantoprazole (PROTONIX) injection 40 mg (has no administration in time range)  ondansetron (ZOFRAN) tablet 4 mg (has no administration in time range)    Or  ondansetron (ZOFRAN) injection 4 mg (has no administration in time range)  methylPREDNISolone sodium succinate (SOLU-MEDROL) 125 mg/2 mL injection 62.5 mg (has no administration in time range)    Followed by  predniSONE (DELTASONE) tablet 20 mg (has no administration in time range)  acetaminophen (TYLENOL) tablet 650 mg (has no administration in time range)  remdesivir 100 mg in sodium chloride 0.9 % 100 mL IVPB (has no administration in time range)    Followed by  remdesivir 100 mg in sodium chloride 0.9 % 100 mL IVPB (has no administration in time range)  ferrous sulfate tablet 325 mg (has no administration in time range)  feeding supplement (ENSURE ENLIVE / ENSURE PLUS) liquid 237 mL (has no administration in time range)  furosemide (LASIX) injection 60 mg (60 mg Intravenous Given 06/23/22 1556)  ceFEPIme (MAXIPIME) 2 g in sodium chloride 0.9 % 100 mL IVPB (0 g Intravenous Stopped 06/23/22 1937)  azithromycin (ZITHROMAX) 500 mg in sodium chloride 0.9 % 250 mL IVPB (0 mg Intravenous Stopped 06/23/22 1937)  vancomycin (VANCOREADY) IVPB 2000 mg/400 mL (0 mg Intravenous Stopped 06/23/22 2003)  iohexol (OMNIPAQUE) 350 MG/ML injection 100 mL (100 mLs Intravenous Contrast Given 06/23/22 1641)  dexamethasone (DECADRON) injection 6 mg (6 mg Intravenous Given 06/23/22 2003)  heparin bolus via infusion 5,000 Units (5,000 Units Intravenous Bolus from Bag 06/23/22 2003)  LORazepam (ATIVAN) injection 1 mg (1 mg Intravenous Given 06/23/22 1955)  LORazepam (ATIVAN) injection 1 mg (1 mg Intravenous Given  06/23/22 2043)    ED Course/ Medical Decision Making/ A&P Clinical Course as of 06/24/22 0044  Fri Jun 23, 2022  1707 Called by radiology. Distal R mainstem and proximal segmental PE noted. Enlarged RV to LV ratio of unclear chronicity.  [RP]  70 Renee daughter confirmed DNR/DNI.  [RP]  2155 Dr Thomes Dinning from hospitalist is accepted the patient. [RP]    Clinical Course User Index [RP] Rondel Baton, MD                           Medical Decision Making Amount and/or Complexity of Data Reviewed Labs: ordered. Radiology: ordered.  Risk Prescription drug management. Decision regarding hospitalization.   GARMON SHINA is a 81 y.o. male with comorbidities that complicate the patient evaluation including  Roman, Bradley Roman, Bradley Roman, and Bradley who presents to the emergency department with shortness of breath, hypoxia, and altered Roman status.  Initial Ddx:  Pneumonia, pulmonary embolism, Bradley Roman, hypercapnic respiratory failure, COVID/flu, pulmonary embolism, ICH  MDM:  Differential is broad at this time given the limited available history.  Based on his physical exam of altered Roman status and bibasilar Rales feel the patient could have a heart failure exacerbation as well as hypercapnic respiratory failure leading to his confusion.  Will also check for COVID and flu which could have led to an exacerbation.  This altered Roman status will check head CT as well as a CTA of the chest to evaluate for pneumonia and pulmonary embolism.  Will start the patient on BiPAP at this time for his work of breathing.  Plan:  Labs [facility] COVID/flu Chest x-ray CTA chest Head CT  ED Summary/Re-evaluation:  Patient was started on BiPAP and remained stable.  CT head did not reveal any abnormalities with a CTA of his chest did reveal a right mainstem pulmonary embolism.  Did have elevated LV to RV ratio but after discussion with radiology they feel that this may be chronic instead of acute.   Patient's BNP was WNL and he was started on heparin drip.  Patient did become agitated while on BiPAP and received a dose of Ativan.  Patient was also found to have COVID-pneumonia and after discussion with hospitalist was started on Decadron and had Paxlovid ordered and will discuss this with pharmacy.  Confirmed DNR/DNI with the patient's daughter.  As such feel that he is suitable for admission here since invasive interventions intubation are not indicated for the patient.  He may require or ongoing goals of care discussions given his current state.  This patient presents to the ED for concern of complaints listed in HPI, this involves an extensive number of treatment options, and is a complaint that carries with it a high risk of complications and morbidity. Disposition including potential need for admission considered.   Dispo: Admit to Step Down  Additional history obtained from daughter Records reviewed EMS Run Sheet and Nursing/Care Facility Documents The following labs were independently interpreted: Chemistry and show no acute abnormality I independently reviewed the following imaging with scope of interpretation limited to determining acute life threatening conditions related to emergency care: CT Head, which revealed no acute abnormality  I personally reviewed and interpreted cardiac monitoring: normal sinus rhythm  I personally reviewed and interpreted the pt's EKG: see above for interpretation  I have reviewed the patients home medications and made adjustments as needed Consults: Hospitalist Social Determinants of health:  [facility] resident  Final Clinical Impression(s) / ED Diagnoses Final diagnoses:  COVID-19  Acute pulmonary embolism, unspecified pulmonary embolism type, unspecified whether acute cor pulmonale present (HCC)  Hypoxia  Hypercapnia  Altered Roman status, unspecified altered Roman status type    Rx / DC Orders ED Discharge Orders     None       CRITICAL  CARE Performed by: Rondel Baton   Total critical care time: 45 minutes  Critical care time was exclusive of separately billable procedures and treating other patients.  Critical care was necessary to treat or prevent imminent or life-threatening deterioration.  Critical care was time spent personally by me on the following activities: development of treatment plan with patient and/or surrogate as well as nursing, discussions with consultants, evaluation of patient's response to treatment, examination of patient, obtaining history from patient or surrogate, ordering and  performing treatments and interventions, ordering and review of laboratory studies, ordering and review of radiographic studies, pulse oximetry and re-evaluation of patient's condition.     Rondel Baton, MD 06/24/22 [phone]

## 2022-06-23 NOTE — ED Notes (Signed)
Came into patient room and he had pulled off bipap and ripped out one of his PIVs.  Patient was trying to come out of the bed and was distracted until order for ativan could be obtained.  Patient given 1 mg ativan and settled down.  Bipap and cardiac monitor replaced.  Patient PIV restarted.

## 2022-06-23 NOTE — ED Notes (Signed)
Patient removed from BiPAP and placed on non-re breather to go to CT.

## 2022-06-23 NOTE — H&P (Signed)
History and Physical    Patient: Bradley Roman LFY:[id number] DOB: [birth year] DOA: 06/23/2022 DOS: the patient was seen and examined on 06/24/2022 PCP: Jani Gravel, MD  Patient coming from: SNF  Chief Complaint:  Chief Complaint  Patient presents with   Shortness of Breath   HPI: Bradley Roman is a 81 y.o. male with medical history significant of dementia who presents to the emergency department via EMS due to shortness of breath.  Patient was diagnosed with pneumonia about 3 weeks ago and patient was on supplemental oxygen via NRB at 4 PM.  Patient was unable to provide history, history was obtained from ED physician and ED medical record.  Per report, patient complained of chest congestion and was verbally not responding, O2 sat checked was 76%, EMS was activated and on arrival of EMS team, NRB was readjusted with improved O2 sats, patient was taken to the ED for further evaluation and management.  ED Course:  In the emergency department, temperature on arrival was 97.5 F, respiratory rate 25/min, HR was 75 bpm, BP was 153/120, patient was transition from NRB to BiPAP at FiO2 of 50% with an O2 sat of 100%.  Workup in the ED showed macrocytic anemia.  BMP was normal except for bicarb of 33 and blood glucose of 105.  Albumin 2.7.  VBG done showed pCO2 of 87% which resulted in patient being transitioned to BiPAP.  Urinalysis was unimpressive for UTI.  Lactic acid 1.7  > 1.9, BNP 19.0.  Influenza A, B was negative, SARS coronavirus 2 was positive.  Blood culture pending CT head without contrast showed no acute intracranial abnormality. CT angiography chest with contrast showed:  Nonocclusive distal right main and proximal segmental right lower and middle lobe pulmonary emboli.  Associated enlarged right to left ventricular ratio of 1.2. No pulmonary infarction. 2. Cardiomegaly with mild pulmonary edema and bilateral trace pleural effusions. 3. Bilateral basilar lower lobe findings suggestive of  pulmonary fibrosis/possible UIP. Consider nonemergent outpatient high-resolution chest CT for further evaluation. 4. Aortic Atherosclerosis (ICD10-I70.0) including four-vessel coronary calcifications. 5.  Emphysema Patient was empirically started on IV cefepime and azithromycin due to initial presumption of CAP POA.  Decadron was given and patient was started on Paxlovid.  IV Lasix was given due to pulmonary edema and bilateral pleural effusion.  Ativan was given and patient was started on heparin drip.  Hospitalist was asked to admit patient for further evaluation and management.  Review of Systems: Review of systems as noted in the HPI. All other systems reviewed and are negative.   Past Medical History:  Diagnosis Date   Cancer ([place])    Dementia ([place])    Past Surgical History:  Procedure Laterality Date   INTRAMEDULLARY (IM) NAIL INTERTROCHANTERIC Left 07/24/2015   Procedure: INTRAMEDULLARY (IM) NAIL INTERTROCHANTRIC AFFIXUS NAIL;  Surgeon: Wylene Simmer, MD;  Location: [facility];  Service: Orthopedics;  Laterality: Left;    Social History:  reports that he has quit smoking. He does not have any smokeless tobacco history on file. He reports that he does not drink alcohol. No history on file for drug use.   No Known Allergies  History reviewed. No pertinent family history.   Prior to Admission medications   Medication Sig Start Date End Date Taking? Authorizing Provider  acetaminophen (TYLENOL) 325 MG tablet Take 2 tablets (650 mg total) by mouth every 6 (six) hours as needed for mild pain (or Fever >/= 101). 07/27/15   Delfina Redwood, MD  donepezil (  ARICEPT) 10 MG tablet Take 1 tablet by mouth at bedtime. 06/27/15   [provider]  enoxaparin (LOVENOX) 40 MG/0.4ML injection Inject 0.4 mLs (40 mg total) into the skin daily. For 2 weeks Patient not taking: Reported on 01/14/2016 07/27/15 08/10/15  Corky Sing, PA-C  HYDROcodone-acetaminophen (NORCO/VICODIN) 5-325 MG tablet  Take 1 tablet by mouth every 6 (six) hours as needed for severe pain (breakthrough pain). Patient not taking: Reported on 01/14/2016 07/27/15   Corky Sing, PA-C  senna-docusate (SENOKOT-S) 8.6-50 MG tablet Take 2 tablets by mouth daily. Patient not taking: Reported on 01/14/2016 07/27/15   Delfina Redwood, MD    Physical Exam: BP 109/79 (BP Location: Right Arm)   Pulse 69   Temp (!) 97 F (36.1 C) (Axillary)   Resp 20   Ht '5\' 6"'$  (1.676 m)   Wt 113.4 kg   SpO2 94%   BMI 40.35 kg/m   General: 81 y.o. year-old male well developed well nourished in no acute distress.  Alert and oriented x3. HEENT: NCAT, EOMI Neck: Supple, trachea medial Cardiovascular: Regular rate and rhythm with no rubs or gallops.  No thyromegaly or JVD noted.  No lower extremity edema. 2/4 pulses in all 4 extremities. Respiratory: Clear to auscultation with no wheezes or rales. Good inspiratory effort. Abdomen: Soft, nontender nondistended with normal bowel sounds x4 quadrants. Muskuloskeletal: No cyanosis, clubbing or edema noted bilaterally Neuro: CN II-XII intact, strength 5/5 x 4, sensation, reflexes intact Skin: No ulcerative lesions noted or rashes Psychiatry: Judgement and insight appear normal. Mood is appropriate for condition and setting          Labs on Admission:  Basic Metabolic Panel: Recent Labs  Lab 06/23/22 1240  NA 142  K 4.8  CL 100  CO2 33*  GLUCOSE 105*  BUN 13  CREATININE 0.89  CALCIUM 8.4*   Liver Function Tests: Recent Labs  Lab 06/23/22 1240  AST 32  ALT 16  ALKPHOS 73  BILITOT 0.9  PROT 7.7  ALBUMIN 2.7*   No results for input(s): "LIPASE", "AMYLASE" in the last 168 hours. No results for input(s): "AMMONIA" in the last 168 hours. CBC: Recent Labs  Lab 06/23/22 1240  WBC 8.0  NEUTROABS 5.4  HGB 11.6*  HCT 39.3  MCV 107.1*  PLT 332   Cardiac Enzymes: No results for input(s): "CKTOTAL", "CKMB", "CKMBINDEX", "TROPONINI" in the last 168 hours.  BNP  (last 3 results) Recent Labs    06/23/22 1549  BNP 90.0    ProBNP (last 3 results) No results for input(s): "PROBNP" in the last 8760 hours.  CBG: No results for input(s): "GLUCAP" in the last 168 hours.  Radiological Exams on Admission: CT Head Wo Contrast  Result Date: 06/23/2022 CLINICAL DATA:  AMS EXAM: CT HEAD WITHOUT CONTRAST TECHNIQUE: Contiguous axial images were obtained from the base of the skull through the vertex without intravenous contrast. RADIATION DOSE REDUCTION: This exam was performed according to the departmental dose-optimization program which includes automated exposure control, adjustment of the mA and/or kV according to patient size and/or use of iterative reconstruction technique. COMPARISON:  CT head 11/30/2014 FINDINGS: Brain: Cerebral ventricle sizes are concordant with the degree of cerebral volume loss. Patchy and confluent areas of decreased attenuation are noted throughout the deep and periventricular white matter of the cerebral hemispheres bilaterally, compatible with chronic microvascular ischemic disease. no evidence of large-territorial acute infarction. No parenchymal hemorrhage. No mass lesion. No extra-axial collection. No mass effect or midline shift.  No hydrocephalus. Basilar cisterns are patent. Vascular: No hyperdense vessel. Skull: No acute fracture or focal lesion. Sinuses/Orbits: Paranasal sinuses and mastoid air cells are clear. Bilateral lens replacement. Otherwise the orbits are unremarkable. Other: None. IMPRESSION: No acute intracranial abnormality. Electronically Signed   By: Iven Finn M.D.   On: 06/23/2022 17:13   CT Angio Chest PE W and/or Wo Contrast  Result Date: 06/23/2022 CLINICAL DATA:  cf PE vs PNA EXAM: CT ANGIOGRAPHY CHEST WITH CONTRAST TECHNIQUE: Multidetector CT imaging of the chest was performed using the standard protocol during bolus administration of intravenous contrast. Multiplanar CT image reconstructions and MIPs  were obtained to evaluate the vascular anatomy. RADIATION DOSE REDUCTION: This exam was performed according to the departmental dose-optimization program which includes automated exposure control, adjustment of the mA and/or kV according to patient size and/or use of iterative reconstruction technique. CONTRAST:  160m OMNIPAQUE IOHEXOL 350 MG/ML SOLN COMPARISON:  None Available. FINDINGS: Cardiovascular: Satisfactory opacification of the pulmonary arteries to the segmental level. Nonocclusive filling defect of the most distal right pulmonary artery with extension into the proximal segmental right lower and right middle lobe pulmonary arteries. Enlarged right atria. Enlarged left atrium. Associated enlarged right to left ventricular ratio of 1. The main pulmonary artery is normal in caliber. No significant pericardial effusion. The thoracic aorta is normal in caliber. Moderate to severe atherosclerotic plaque of the thoracic aorta. Four-vessel coronary artery calcifications. Mediastinum/Nodes: No enlarged mediastinal, hilar, or axillary lymph nodes. Thyroid gland, trachea, and esophagus demonstrate no significant findings. Lungs/Pleura: Respiratory motion artifact with limited evaluation. Mild paraseptal emphysematous changes. Mild interlobular septal wall thickening. Bilateral basilar lower lobe, right greater than left, honeycombing. No focal consolidation. No pulmonary nodule. No pulmonary mass. Trace bilateral pleural effusions. No pneumothorax. Upper Abdomen: No acute abnormality. Musculoskeletal: No chest wall abnormality. No suspicious lytic or blastic osseous lesions. No acute displaced fracture. Multilevel moderate degenerative changes of the spine. Review of the MIP images confirms the above findings. IMPRESSION: 1. Nonocclusive distal right main and proximal segmental right lower and middle lobe pulmonary emboli. Associated enlarged right to left ventricular ratio of 1.2. No pulmonary infarction. 2.  Cardiomegaly with mild pulmonary edema and bilateral trace pleural effusions. 3. 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 Consider nonemergent outpatient high-resolution chest CT for further evaluation. 4. Aortic Atherosclerosis (ICD10-I70.0) including four-vessel coronary calcifications. 5.  Emphysema (ICD10-J43.9). These results were called by telephone at the time of interpretation on 06/23/2022 at 5:07 pm to provider RMargaretmary Eddy, who verbally acknowledged these results. Electronically Signed   By: MIven FinnM.D.   On: 06/23/2022 17:11   DG Chest Port 1 View  Result Date: 06/23/2022 CLINICAL DATA:  Shortness of breath. Diagnosed with pneumonia 3 weeks ago. Congestion. EXAM: PORTABLE CHEST 1 VIEW COMPARISON:  AP chest 07/24/2015; CT chest 12/28/2009 FINDINGS: There are moderately to markedly decreased lung volumes, limiting evaluation. The cardiac silhouette may be mildly enlarged. Mediastinal contours are grossly within normal limits for low lung volumes. Moderate calcifications within the aortic arch. Moderate bilateral interstitial thickening appears unchanged from 07/24/2015 prior radiographs, especially given the lower lung volumes on the current exam. There is bibasilar bronchovascular crowding. Evaluation of the lung bases is limited. No large pleural effusion is seen. No pneumothorax. 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 IMPRESSION: 1. Moderately to markedly decreased lung volumes, limiting evaluation. 2. Moderate bilateral interstitial thickening appears not significantly changed from 07/24/2015, especially given the lower lung volumes on the  current exam. This is favored represent chronic interstitial lung disease. Electronically Signed   By: Yvonne Kendall M.D.   On: 06/23/2022 14:31    EKG: I independently viewed the EKG done and my findings are as followed: Atrial fibrillation with rate  control  Assessment/Plan Present on Admission:  COVID-19 virus infection  Dementia ([place])  Principal Problem:   COVID-19 virus infection Active Problems:   Dementia ([place])   Pulmonary embolus ([place])   Acute respiratory failure with hypoxia and hypercapnia ([facility])   Pulmonary edema   Bilateral pleural effusion   Pulmonary fibrosis ([facility])   Macrocytic anemia   Iron deficiency anemia  COVID-19 virus infection SARS coronavirus 2 was positive Continue albuterol q.6h Continue Solu-Medrol Continue IV Remdesivir per pharmacy protocol Continue vitamin-C 500 mg p.o. Daily Continue zinc 220 mg p.o. Daily Continue Robitussin and Tussionex Continue Tylenol p.r.n. for fever Continue supplemental oxygen to maintain O2 sat > or = 94% with plan to wean patient off supplemental oxygen as tolerated (of note, patient does not use oxygen at baseline) Continue incentive spirometry and flutter valve q54mn as tolerated Continue monitoring daily inflammatory markers  Acute pulmonary embolus CT angiography chest with contrast showed nonocclusive  distal right main and proximal segmental right lower and middle lobe pulmonary emboli. Patient was started on IV heparin drip with plan to transition to DSaksin the morning Bilateral lower extremity ultrasound will be done in the morning Echocardiogram will be done in the morning  Pulmonary edema and bilateral pleural effusions, rule out CHF CT angiography of chest showed  Cardiomegaly with mild pulmonary edema and bilateral trace pleural effusions. Lasix was temporarily held at this time due to soft BP Continue total input/output, daily weights and fluid restriction Continue heart healthy diet  Echocardiogram in the morning   Acute respiratory failure with hypoxia and hypercapnia Continue BiPAP with plan to transition patient to supplemental oxygen and eventually wean patient off oxygen as tolerated  Macrocytic anemia MCV 107.1, folate and vitamin B12  levels will be checked  Iron deficiency anemia Continue ferrous sulfate  Pulmonary fibrosis CT angiography of chest showed Bilateral basilar lower lobe findings suggestive of pulmonary fibrosis/possible UIP Nonemergent outpatient chest HRCT was recommended  Hypoalbuminemia possibly secondary to moderate protein calorie malnutrition Albumin 2.7, protein supplement will be provided  Dementia Continue Aricept  DVT prophylaxis: Heparin drip  Code Status: Full code  Family Communication: None at bedside  Consults: None  Severity of Illness: The appropriate patient status for this patient is INPATIENT. Inpatient status is judged to be reasonable and necessary in order to provide the required intensity of service to ensure the patient's safety. The patient's presenting symptoms, physical exam findings, and initial radiographic and laboratory data in the context of their chronic comorbidities is felt to place them at high risk for further clinical deterioration. Furthermore, it is not anticipated that the patient will be medically stable for discharge from the hospital within 2 midnights of admission.   * I certify that at the point of admission it is my clinical judgment that the patient will require inpatient hospital care spanning beyond 2 midnights from the point of admission due to high intensity of service, high risk for further deterioration and high frequency of surveillance required.*  Author: OBernadette Hoit DO 06/24/2022 12:42 AM  For on call review [email or web address]

## 2022-06-23 NOTE — ED Triage Notes (Signed)
BIB EMS with complaints of SHOB. Has been diagnosed with PNA three weeks ago. EMS reports that facility had patient on NRB at 4LPM laying flat in bed. Reports congestion. Patient is not verbally responding. Questionable AMS. Sats were 76% prior to EMS correctly placing NRB.

## 2022-06-23 NOTE — Progress Notes (Signed)
[place] for heparin Indication: pulmonary embolus  Heparin Dosing Weight: 89.8 kg  Labs: Recent Labs    06/23/22 1240  HGB 11.6*  HCT 39.3  PLT 332  LABPROT 15.9*  INR 1.3*  CREATININE 0.89    Assessment: 3 yom presenting with SOB, found to have acute nonocclusive PE with associated enlarged R to L ventricular ratio of 1:2. Pharmacy consulted to dose heparin. Patient is not on anticoagulation PTA. Hg 11.6, plt WNL. No bleed issues reported.  Goal of Therapy:  Heparin level 0.3-0.7 units/ml Monitor platelets by anticoagulation protocol: Yes   Plan:  Heparin 5000 unit bolus Start heparin at 1500 units/hr Check 6hr heparin level Monitor daily CBC, s/sx bleeding   Arturo Morton, PharmD, BCPS Clinical Pharmacist 06/23/2022 6:55 PM ote

## 2022-06-23 NOTE — ED Notes (Signed)
Test pos  covid 05/01/22

## 2022-06-23 NOTE — Progress Notes (Signed)
Patient placed on full face bipap 16/6 rate 16 at 50%.  Patient tolerating well at this time.

## 2022-06-23 NOTE — Progress Notes (Signed)
Pharmacy Antibiotic Note  Bradley Roman is a 81 y.o. male admitted on 06/23/2022 with pneumonia.  Pharmacy has been consulted for vancomycin dosing.  Plan: Vancomycin 2000 mg IV x 1 dose. Vancomycin 1500 mg IV every 24 hours. Cefepime 2000 mg iV x 1 dose. F/U additional orders. Monitor labs, c/s, and vanco level as indicated.  Height: '5\' 6"'$  (167.6 cm) Weight: 113.4 kg (250 lb) IBW/kg (Calculated) : 63.8  No data recorded.  Recent Labs  Lab 06/23/22 1240  WBC 8.0  CREATININE 0.89  LATICACIDVEN 1.7    Estimated Creatinine Clearance: 75.7 mL/min (by C-G formula based on SCr of 0.89 mg/dL).    No Known Allergies  Antimicrobials this admission: Vanco 12/15 >> Cefepime 12/15 >>  Microbiology results: 12/15 BCx: pending  12/15 MRSA PCR: pending  Thank you for allowing pharmacy to be a part of this patient's care.  Margot Ables, PharmD Clinical Pharmacist 06/23/2022 4:08 PM

## 2022-06-24 ENCOUNTER — Inpatient Hospital Stay (HOSPITAL_COMMUNITY): Payer: Medicare Other

## 2022-06-24 DIAGNOSIS — D539 Nutritional anemia, unspecified: Secondary | ICD-10-CM | POA: Insufficient documentation

## 2022-06-24 DIAGNOSIS — J90 Pleural effusion, not elsewhere classified: Secondary | ICD-10-CM | POA: Insufficient documentation

## 2022-06-24 DIAGNOSIS — J9601 Acute respiratory failure with hypoxia: Secondary | ICD-10-CM | POA: Insufficient documentation

## 2022-06-24 DIAGNOSIS — J9602 Acute respiratory failure with hypercapnia: Secondary | ICD-10-CM | POA: Insufficient documentation

## 2022-06-24 DIAGNOSIS — I2699 Other pulmonary embolism without acute cor pulmonale: Secondary | ICD-10-CM | POA: Insufficient documentation

## 2022-06-24 DIAGNOSIS — U071 COVID-19: Secondary | ICD-10-CM | POA: Diagnosis not present

## 2022-06-24 DIAGNOSIS — J1282 Pneumonia due to coronavirus disease 2019: Secondary | ICD-10-CM | POA: Diagnosis not present

## 2022-06-24 DIAGNOSIS — D509 Iron deficiency anemia, unspecified: Secondary | ICD-10-CM | POA: Insufficient documentation

## 2022-06-24 DIAGNOSIS — R4182 Altered mental status, unspecified: Secondary | ICD-10-CM

## 2022-06-24 DIAGNOSIS — I2609 Other pulmonary embolism with acute cor pulmonale: Secondary | ICD-10-CM | POA: Diagnosis not present

## 2022-06-24 DIAGNOSIS — J8410 Pulmonary fibrosis, unspecified: Secondary | ICD-10-CM | POA: Insufficient documentation

## 2022-06-24 DIAGNOSIS — J811 Chronic pulmonary edema: Secondary | ICD-10-CM | POA: Insufficient documentation

## 2022-06-24 LAB — D-DIMER, QUANTITATIVE: D-Dimer, Quant: 2.76 ug/mL-FEU — ABNORMAL HIGH (ref 0.00–0.50)

## 2022-06-24 LAB — BLOOD GAS, ARTERIAL
Acid-Base Excess: 14.1 mmol/L — ABNORMAL HIGH (ref 0.0–2.0)
Acid-Base Excess: 23.4 mmol/L — ABNORMAL HIGH (ref 0.0–2.0)
Bicarbonate: 47.7 mmol/L — ABNORMAL HIGH (ref 20.0–28.0)
Bicarbonate: 50.6 mmol/L — ABNORMAL HIGH (ref 20.0–28.0)
Drawn by: 35043
Drawn by: 35043
FIO2: 50 %
FIO2: 50 %
O2 Saturation: 96.9 %
O2 Saturation: 96 %
Patient temperature: 34
Patient temperature: 35.1
pCO2 arterial: 107 mmHg (ref 32–48)
pCO2 arterial: 63 mmHg — ABNORMAL HIGH (ref 32–48)
pH, Arterial: 7.24 — ABNORMAL LOW (ref 7.35–7.45)
pH, Arterial: 7.51 — ABNORMAL HIGH (ref 7.35–7.45)
pO2, Arterial: 68 mmHg — ABNORMAL LOW (ref 83–108)
pO2, Arterial: 60 mmHg — ABNORMAL LOW (ref 83–108)

## 2022-06-24 LAB — COMPREHENSIVE METABOLIC PANEL
ALT: 16 U/L (ref 0–44)
AST: 22 U/L (ref 15–41)
Albumin: 2.7 g/dL — ABNORMAL LOW (ref 3.5–5.0)
Alkaline Phosphatase: 76 U/L (ref 38–126)
Anion gap: 8 (ref 5–15)
BUN: 16 mg/dL (ref 8–23)
CO2: 37 mmol/L — ABNORMAL HIGH (ref 22–32)
Calcium: 8.4 mg/dL — ABNORMAL LOW (ref 8.9–10.3)
Chloride: 99 mmol/L (ref 98–111)
Creatinine, Ser: 0.83 mg/dL (ref 0.61–1.24)
GFR, Estimated: >60 mL/min (ref >60)
Glucose, Bld: 161 mg/dL — ABNORMAL HIGH (ref 70–99)
Potassium: 4.3 mmol/L (ref 3.5–5.1)
Sodium: 144 mmol/L (ref 135–145)
Total Bilirubin: 0.6 mg/dL (ref 0.3–1.2)
Total Protein: 7.6 g/dL (ref 6.5–8.1)

## 2022-06-24 LAB — CBC WITH DIFFERENTIAL/PLATELET
Abs Immature Granulocytes: 0.17 10*3/uL — ABNORMAL HIGH (ref 0.00–0.07)
Basophils Absolute: 0 10*3/uL (ref 0.0–0.1)
Basophils Relative: 1 %
Eosinophils Absolute: 0 10*3/uL (ref 0.0–0.5)
Eosinophils Relative: 0 %
HCT: 39.7 % (ref 39.0–52.0)
Hemoglobin: 11.5 g/dL — ABNORMAL LOW (ref 13.0–17.0)
Immature Granulocytes: 2 %
Lymphocytes Relative: 9 %
Lymphs Abs: 0.7 10*3/uL (ref 0.7–4.0)
MCH: 31.5 pg (ref 26.0–34.0)
MCHC: 29 g/dL — ABNORMAL LOW (ref 30.0–36.0)
MCV: 108.8 fL — ABNORMAL HIGH (ref 80.0–100.0)
Monocytes Absolute: 0.1 10*3/uL (ref 0.1–1.0)
Monocytes Relative: 2 %
Neutro Abs: 6.8 10*3/uL (ref 1.7–7.7)
Neutrophils Relative %: 86 %
Platelets: 318 10*3/uL (ref 150–400)
RBC: 3.65 MIL/uL — ABNORMAL LOW (ref 4.22–5.81)
RDW: 17.6 % — ABNORMAL HIGH (ref 11.5–15.5)
WBC: 7.8 10*3/uL (ref 4.0–10.5)
nRBC: 0.3 % — ABNORMAL HIGH (ref 0.0–0.2)

## 2022-06-24 LAB — C-REACTIVE PROTEIN: CRP: 6.4 mg/dL — ABNORMAL HIGH (ref <1.0)

## 2022-06-24 LAB — ECHOCARDIOGRAM COMPLETE
Height: 66 in
S' Lateral: 3.1 cm
Weight: 4000 oz

## 2022-06-24 LAB — GLUCOSE, CAPILLARY
Glucose-Capillary: 97 mg/dL (ref 70–99)
Glucose-Capillary: 89 mg/dL (ref 70–99)

## 2022-06-24 LAB — FERRITIN: Ferritin: 180 ng/mL (ref 24–336)

## 2022-06-24 LAB — VITAMIN B12: Vitamin B-12: 646 pg/mL (ref 180–914)

## 2022-06-24 LAB — T4, FREE: Free T4: 1.05 ng/dL (ref 0.61–1.12)

## 2022-06-24 LAB — CBG MONITORING, ED
Glucose-Capillary: 155 mg/dL — ABNORMAL HIGH (ref 70–99)
Glucose-Capillary: 107 mg/dL — ABNORMAL HIGH (ref 70–99)

## 2022-06-24 LAB — PROCALCITONIN: Procalcitonin: <0.1 ng/mL

## 2022-06-24 LAB — CULTURE, BLOOD (ROUTINE X 2)

## 2022-06-24 LAB — FOLATE: Folate: 7.3 ng/mL (ref >5.9)

## 2022-06-24 LAB — HEPARIN LEVEL (UNFRACTIONATED)
Heparin Unfractionated: >1.1 IU/mL — ABNORMAL HIGH (ref 0.30–0.70)
Heparin Unfractionated: >1.1 IU/mL — ABNORMAL HIGH (ref 0.30–0.70)

## 2022-06-24 LAB — TSH: TSH: 3.083 u[IU]/mL (ref 0.350–4.500)

## 2022-06-24 LAB — PHOSPHORUS: Phosphorus: 6 mg/dL — ABNORMAL HIGH (ref 2.5–4.6)

## 2022-06-24 LAB — CORTISOL-AM, BLOOD: Cortisol - AM: 4.9 ug/dL — ABNORMAL LOW (ref 6.7–22.6)

## 2022-06-24 LAB — APTT
aPTT: >200 seconds (ref 24–36)
aPTT: 128 seconds — ABNORMAL HIGH (ref 24–36)

## 2022-06-24 LAB — MAGNESIUM: Magnesium: 2.1 mg/dL (ref 1.7–2.4)

## 2022-06-24 LAB — MRSA NEXT GEN BY PCR, NASAL: MRSA by PCR Next Gen: NOT DETECTED

## 2022-06-24 LAB — LACTIC ACID, PLASMA: Lactic Acid, Venous: 1.4 mmol/L (ref 0.5–1.9)

## 2022-06-24 MED ORDER — HEPARIN (PORCINE) 25000 UT/250ML-% IV SOLN
1100.0000 [IU]/h | INTRAVENOUS | Status: DC
  Administered 2022-06-24 (×2): 1300 [IU]/h via INTRAVENOUS
  Filled 2022-06-24: qty 250

## 2022-06-24 MED ORDER — LORAZEPAM 2 MG/ML IJ SOLN
0.5000 mg | Freq: Once | INTRAMUSCULAR | Status: AC
  Administered 2022-06-24: 0.5 mg via INTRAVENOUS
  Filled 2022-06-24: qty 1

## 2022-06-24 MED ORDER — IPRATROPIUM-ALBUTEROL 0.5-2.5 (3) MG/3ML IN SOLN
3.0000 mL | Freq: Four times a day (QID) | RESPIRATORY_TRACT | Status: DC
  Administered 2022-06-24 – 2022-06-27 (×13): 3 mL via RESPIRATORY_TRACT
  Filled 2022-06-24 (×13): qty 3

## 2022-06-24 MED ORDER — SODIUM CHLORIDE 0.9 % IV SOLN
2.0000 g | Freq: Three times a day (TID) | INTRAVENOUS | Status: DC
  Administered 2022-06-24 – 2022-06-25 (×4): 2 g via INTRAVENOUS
  Filled 2022-06-24 (×4): qty 12.5

## 2022-06-24 MED ORDER — SODIUM CHLORIDE 0.9 % IV SOLN: 200.0000 mg | Freq: Once | INTRAVENOUS | Status: DC

## 2022-06-24 MED ORDER — METHYLPREDNISOLONE SODIUM SUCC 125 MG IJ SOLR: 62.5000 mg | Freq: Every day | INTRAMUSCULAR | Status: DC

## 2022-06-24 MED ORDER — METHYLPREDNISOLONE SODIUM SUCC 125 MG IJ SOLR
60.0000 mg | Freq: Two times a day (BID) | INTRAMUSCULAR | Status: DC
  Administered 2022-06-24 – 2022-06-25 (×3): 60 mg via INTRAVENOUS
  Filled 2022-06-24 (×3): qty 2

## 2022-06-24 MED ORDER — FUROSEMIDE 10 MG/ML IJ SOLN
40.0000 mg | Freq: Once | INTRAMUSCULAR | Status: AC
  Administered 2022-06-24: 40 mg via INTRAVENOUS
  Filled 2022-06-24: qty 4

## 2022-06-24 MED ORDER — PREDNISONE 20 MG PO TABS: 20.0000 mg | ORAL_TABLET | Freq: Every day | ORAL | Status: DC

## 2022-06-24 MED ORDER — SODIUM CHLORIDE 0.9 % IV SOLN
100.0000 mg | INTRAVENOUS | Status: AC
  Administered 2022-06-24 (×2): 100 mg via INTRAVENOUS
  Filled 2022-06-24 (×2): qty 20

## 2022-06-24 MED ORDER — FERROUS SULFATE 325 (65 FE) MG PO TABS: 325.0000 mg | ORAL_TABLET | ORAL | Status: DC

## 2022-06-24 MED ORDER — SODIUM CHLORIDE 0.9 % IV SOLN: 100.0000 mg | Freq: Every day | INTRAVENOUS | Status: DC

## 2022-06-24 MED ORDER — CHLORHEXIDINE GLUCONATE CLOTH 2 % EX PADS
6.0000 | MEDICATED_PAD | Freq: Every day | CUTANEOUS | Status: DC
  Administered 2022-06-24 – 2022-06-27 (×4): 6 via TOPICAL

## 2022-06-24 MED ORDER — INSULIN ASPART 100 UNIT/ML IJ SOLN
0.0000 [IU] | INTRAMUSCULAR | Status: DC
  Administered 2022-06-24: 2 [IU] via SUBCUTANEOUS
  Administered 2022-06-25 – 2022-06-27 (×11): 1 [IU] via SUBCUTANEOUS
  Filled 2022-06-24: qty 1

## 2022-06-24 MED ORDER — ACETAMINOPHEN 325 MG PO TABS: 650.0000 mg | ORAL_TABLET | Freq: Four times a day (QID) | ORAL | Status: DC | PRN

## 2022-06-24 MED ORDER — ENSURE ENLIVE PO LIQD
237.0000 mL | Freq: Two times a day (BID) | ORAL | Status: DC
  Filled 2022-06-24 (×2): qty 237

## 2022-06-24 MED ORDER — SODIUM CHLORIDE 0.9 % IV SOLN
100.0000 mg | Freq: Every day | INTRAVENOUS | Status: AC
  Administered 2022-06-26: 100 mg via INTRAVENOUS
  Filled 2022-06-24: qty 100
  Filled 2022-06-24: qty 20

## 2022-06-24 NOTE — Progress Notes (Addendum)
Breathing pattern 40 to 0 , repeats after pause back to 40 , with greater inspirations till peak. Possible Cheyne Stokes breathing pattern

## 2022-06-24 NOTE — ED Notes (Signed)
Upon my assessment, pt feels very cool to touch, rectal temp obtained, read 3 F, verbal order for bear hugger & temp foley obtained.

## 2022-06-24 NOTE — Progress Notes (Signed)
Changed settings on Bipap after ABG resulted.  Will get another gas in about an hour to see if trending down instead of up as before.

## 2022-06-24 NOTE — Progress Notes (Signed)
Transferred patient from ED to ICU 01 without incident.  Patient taken off Bipap and placed on [place] HFNC at 6L with sat between 93 and 96%.  Patient appears to be more alert now, looking around, and moving extremties more.  Report was given earlier to ICU RT about patient moving from ED to ICU.

## 2022-06-24 NOTE — Progress Notes (Addendum)
PROGRESS NOTE  Bradley Roman ZOX:[id number] DOB: 04-Aug-1940 DOA: 06/23/2022 PCP: Jani Gravel, MD  Brief History:  81 year old male with a history of dementia, CKD stage II, obesity, prostate cancer, presenting from [facility] with shortness of breath, cough, and chest congestion.  Due to the patient's encephalopathy, the patient is unable to provide history.  History is obtained from review the medical record and speaking with the patient's daughter.  Unfortunately, the patient's daughter is unable to provide any significant history regarding the patient's current illness other than to say that the nursing facility had told her they have been treating the patient for pneumonia.  Also, she was also notified sometime after Thanksgiving that he had a blood clot in his leg, and the patient was being started on " blood thinners".  She is not aware that he had any scans of his chest.  Nevertheless, EMS was activated secondary to the above symptoms and altered mental status and confusion.  The patient was noted to have oxygen saturation of 76% on room air.  He was placed on nonrebreather with improvement and brought to the emergency department.  Review of his medical record from [facility] shows that the patient was recently started on apixaban for DVT.  It appears that the start date was 06/15/2022.  Interestingly, the patient had been treated for a diagnosis of pneumonia in the past 2 weeks with Augmentin and azithromycin. In the ED, the patient was afebrile and hemodynamically stable albeit with soft blood pressures initially.  He was somewhat agitated was given Ativan x 2 doses and placed on BiPAP.  WBC 8.0, hemoglobin 9.6, platelets 232,000.  Sodium 142, potassium 4.8, bicarbonate 33, serum creatinine 0.89.  AST 32, ALT 16, alk phosphatase 73, total bilirubin 0.9.  Lactic acid 1.7>> 1.9.  EKG showed atrial fibrillation with nonspecific T wave changes.  VBG showed 7.3 5/87/36/48.  CTA chest was  positive for PE in the right middle lobe and right lower lobe.  There is mild interlobular septal thickening.  There is no consolidation.  There is trace bilateral pleural effusions.  RV: LV ratio 1.2.  CT of the brain was negative.  The patient was started on IV heparin.  He was given a dose of IV furosemide 60 mg IV.  He was also given vancomycin, cefepime, azithromycin.  He was admitted for further treatment and evaluation of his respiratory failure.   Assessment/Plan: Acute respiratory failure with hypoxia and hypercarbia -Secondary to COVID-19 pneumonia in the setting of underlying ILD pulmonary embolus, and pulmonary edema -Wean off BiPAP as tolerated -Repeat ABG  COVID-19 pneumonia -Check inflammatory markers -Continue IV Solu-Medrol -Continue remdesivir -Vitamin C and zinc once are alert enough to take p.o.  Acute pulmonary embolus -06/23/2022 CTA chest--+PE RML, RLL -After review of the records, suspect what may have been felt to be pneumonia initially was likely PE that may have been present -Therefore, do not feel that this is failure of the patient's apixaban -Rather that his respiratory failure decompensation is attributable to COVID-19 and fluid overload on top of his suspected existing PE  Acute diastolic CHF -Echocardiogram -There are signs of volume overload -Appears that the patient was given intramuscular dose of furosemide at [facility] -Repeat Lasix IV x 1 -obtain ReDS if possible  Atrial fibrillation, type unspecified -Personally reviewed EKG--atrial fibrillation, nonspecific T wave change -Reviewed telemetry--rate controlled A-fib -Already on anticoagulation with IV heparin  Interstitial lung disease -There is  evidence on the patient's CTA chest he has underlying existing ILD as there is lower lobe honeycombing noted -This certainly will increase the patient's morbidity given his acute medical illnesses  Major neurocognitive disorder -Restart Aricept once  able to take p.o. -Patient was on Depakote 250 mg twice daily at [facility]  Acute metabolic encephalopathy -Multifactorial including respiratory failure, infectious process -CT brain negative -UA negative for pyuria  CKD stage II -Baseline creatinine 0.8-1.1 -Monitor with diuresis  Morbid obesity -BMI 40.35  Hyperglycemia -Check hemoglobin A1c -Anticipate elevated CBGs secondary to steroids -NovoLog sliding scale  GOC -DNR confirmed with the patient's daughter -Daughter stated that she would consider transition to comfort care if there is no improvement in his clinical condition   Family Communication: daughter updated  Consultants:  none  Code Status: DNR  DVT Prophylaxis:  IV Heparin   Procedures: As Listed in Progress Note Above  Antibiotics: Vanc 12/15>> Cefepime 12/15 Azithro 12/15   The patient is critically ill with multiple organ systems failure and requires high complexity decision making for assessment and support, frequent evaluation and titration of therapies, application of advanced monitoring technologies and extensive interpretation of multiple databases.  Critical care time - 45 mins.     Subjective: Patient is somnolent.  Review of systems unobtainable.  Objective: Vitals:   06/24/22 1100 06/24/22 1115 06/24/22 1130 06/24/22 1201  BP: 111/71 102/61 (!) 113/52 108/68  Pulse: 67 61 (!) 57 70  Resp: (!) 25 (!) 24 (!) 23 20  Temp: (!) 94.6 F (34.8 C) (!) 94.8 F (34.9 C) (!) 94.8 F (34.9 C) (!) 95.2 F (35.1 C)  TempSrc:      SpO2: 95% 95% 95% 99%  Weight:      Height:       No intake or output data in the 24 hours ending 06/24/22 1405 Weight change:  Exam:  General:  Pt is somnolent, does not follow commands appropriately, not in acute distress HEENT: No icterus, No thrush, No neck mass, [place]/AT Cardiovascular: IRRR, S1/S2, no rubs, no gallops Respiratory: Bilateral rales.  No wheezing Abdomen: Soft/+BS, non tender, non  distended, no guarding Extremities: 1+ LE edema, No lymphangitis, No petechiae, No rashes, no synovitis   Data Reviewed: I have personally reviewed following labs and imaging studies Basic Metabolic Panel: Recent Labs  Lab 06/23/22 1240 06/24/22 0624  NA 142 144  K 4.8 4.3  CL 100 99  CO2 33* 37*  GLUCOSE 105* 161*  BUN 13 16  CREATININE 0.89 0.83  CALCIUM 8.4* 8.4*  MG  --  2.1  PHOS  --  6.0*   Liver Function Tests: Recent Labs  Lab 06/23/22 1240 06/24/22 0624  AST 32 22  ALT 16 16  ALKPHOS 73 76  BILITOT 0.9 0.6  PROT 7.7 7.6  ALBUMIN 2.7* 2.7*   No results for input(s): "LIPASE", "AMYLASE" in the last 168 hours. No results for input(s): "AMMONIA" in the last 168 hours. Coagulation Profile: Recent Labs  Lab 06/23/22 1240  INR 1.3*   CBC: Recent Labs  Lab 06/23/22 1240 06/24/22 0624  WBC 8.0 7.8  NEUTROABS 5.4 6.8  HGB 11.6* 11.5*  HCT 39.3 39.7  MCV 107.1* 108.8*  PLT 332 318   Cardiac Enzymes: No results for input(s): "CKTOTAL", "CKMB", "CKMBINDEX", "TROPONINI" in the last 168 hours. BNP: Invalid input(s): "POCBNP" CBG: Recent Labs  Lab 06/24/22 0741 06/24/22 1301  GLUCAP 155* 107*   HbA1C: No results for input(s): "HGBA1C" in the last 72  hours. Urine analysis:    Component Value Date/Time   COLORURINE COLORLESS (A) 06/23/2022 1400   APPEARANCEUR CLEAR 06/23/2022 1400   LABSPEC 1.005 06/23/2022 1400   PHURINE 5.0 06/23/2022 1400   GLUCOSEU NEGATIVE 06/23/2022 1400   HGBUR SMALL (A) 06/23/2022 1400   BILIRUBINUR NEGATIVE 06/23/2022 1400   KETONESUR NEGATIVE 06/23/2022 1400   PROTEINUR NEGATIVE 06/23/2022 1400   UROBILINOGEN 0.2 12/22/2009 2127   NITRITE NEGATIVE 06/23/2022 1400   LEUKOCYTESUR NEGATIVE 06/23/2022 1400   Sepsis Labs: _0 (procalcitonin:4,lacticidven:4) ) Recent Results (from the past 240 hour(s))  Culture, blood (Routine x 2)     Status: None (Preliminary result)   Collection Time: 06/23/22 12:40 PM    Specimen: BLOOD  Result Value Ref Range Status   Specimen Description BLOOD  Final   Special Requests   Final    RIGHT ANTECUBITAL BOTTLES DRAWN AEROBIC AND ANAEROBIC Blood Culture adequate volume   Culture   Final    NO GROWTH < 12 HOURS Performed at [facility], [address], [place], [place] [address]    Report Status PENDING  Incomplete  Culture, blood (Routine x 2)     Status: None (Preliminary result)   Collection Time: 06/23/22  2:47 PM   Specimen: BLOOD  Result Value Ref Range Status   Specimen Description BLOOD BLOOD LEFT HAND  Final   Special Requests   Final    BOTTLES DRAWN AEROBIC AND ANAEROBIC Blood Culture adequate volume BLOOD LEFT HAND   Culture   Final    NO GROWTH < 12 HOURS Performed at [facility], [address] [place], [place] [address]    Report Status PENDING  Incomplete  Resp panel by RT-PCR (RSV, Flu A&B, Covid) Anterior Nasal Swab     Status: Abnormal   Collection Time: 06/23/22  3:19 PM   Specimen: Anterior Nasal Swab  Result Value Ref Range Status   SARS Coronavirus 2 by RT PCR POSITIVE (A) NEGATIVE Final    Comment: (NOTE) SARS-CoV-2 target nucleic acids are DETECTED.  The SARS-CoV-2 RNA is generally detectable in upper respiratory specimens during the acute phase of infection. Positive results are indicative of the presence of the identified virus, but do not rule out bacterial infection or co-infection with other pathogens not detected by the test. Clinical correlation with patient history and other diagnostic information is necessary to determine patient infection status. The expected result is Negative.  Fact Sheet for Patients: EntrepreneurPulse.com.au  Fact Sheet for Healthcare Providers: IncredibleEmployment.be  This test is not yet approved or cleared by the [place] FDA and  has been authorized for detection and/or diagnosis of SARS-CoV-2 by FDA under an Emergency Use Authorization (EUA).   This EUA will remain in effect (meaning this test can be used) for the duration of  the COVID-19 declaration under Section 564(b)(1) of the A ct, 21 U.S.C. section 360bbb-3(b)(1), unless the authorization is terminated or revoked sooner.     Influenza A by PCR NEGATIVE NEGATIVE Final   Influenza B by PCR NEGATIVE NEGATIVE Final    Comment: (NOTE) The Xpert Xpress SARS-CoV-2/FLU/RSV plus assay is intended as an aid in the diagnosis of influenza from Nasopharyngeal swab specimens and should not be used as a sole basis for treatment. Nasal washings and aspirates are unacceptable for Xpert Xpress SARS-CoV-2/FLU/RSV testing.  Fact Sheet for Patients: EntrepreneurPulse.com.au  Fact Sheet for Healthcare Providers: IncredibleEmployment.be  This test is not yet approved or cleared by the [place] FDA and has been authorized for detection  and/or diagnosis of SARS-CoV-2 by FDA under an Emergency Use Authorization (EUA). This EUA will remain in effect (meaning this test can be used) for the duration of the COVID-19 declaration under Section 564(b)(1) of the Act, 21 U.S.C. section 360bbb-3(b)(1), unless the authorization is terminated or revoked.     Resp Syncytial Virus by PCR NEGATIVE NEGATIVE Final    Comment: (NOTE) Fact Sheet for Patients: EntrepreneurPulse.com.au  Fact Sheet for Healthcare Providers: IncredibleEmployment.be  This test is not yet approved or cleared by the [place] FDA and has been authorized for detection and/or diagnosis of SARS-CoV-2 by FDA under an Emergency Use Authorization (EUA). This EUA will remain in effect (meaning this test can be used) for the duration of the COVID-19 declaration under Section 564(b)(1) of the Act, 21 U.S.C. section 360bbb-3(b)(1), unless the authorization is terminated or revoked.  Performed at [facility], [address] [place],  [address]       Scheduled Meds:  donepezil  10 mg Oral QHS   insulin aspart  0-9 Units Subcutaneous Q4H   ipratropium-albuterol  3 mL Nebulization Q6H   methylPREDNISolone (SOLU-MEDROL) injection  60 mg Intravenous Q12H   pantoprazole (PROTONIX) IV  40 mg Intravenous QHS   Continuous Infusions:  ceFEPime (MAXIPIME) IV 2 g (06/24/22 1102)   heparin 1,300 Units/hr (06/24/22 1102)   [START ON 06/25/2022] remdesivir 100 mg in sodium chloride 0.9 % 100 mL IVPB     vancomycin      Procedures/Studies: ECHOCARDIOGRAM COMPLETE  Result Date: 06/24/2022    ECHOCARDIOGRAM REPORT   Patient Name:   EMERIL STILLE Date of Exam: 06/24/2022 Medical Rec #:  [id number]      Height:       66.0 in Accession #:    [id number]     Weight:       250.0 lb Date of Birth:  [birth year]      BSA:          2.199 m Patient Age:    31 years       BP:           102/54 mmHg Patient Gender: M              HR:           71 bpm. Exam Location:  [facility] Procedure: 2D Echo, Color Doppler and Cardiac Doppler Indications:    I26.02 Pulmonary embolus  History:        Patient has no prior history of Echocardiogram examinations.  Sonographer:    Raquel Sarna Senior RDCS Referring Phys: [id number] OLADAPO ADEFESO  Sonographer Comments: Technically difficult study due to lung interference; COVID+ on bipap IMPRESSIONS  1. Left ventricular ejection fraction, by estimation, is 50 to 55%. The left ventricle has low normal function. The left ventricle has no regional wall motion abnormalities. There is mild left ventricular hypertrophy. Left ventricular diastolic parameters are indeterminate.  2. Right ventricular systolic function is low normal. The right ventricular size is normal. There is mildly elevated pulmonary artery systolic pressure. The estimated right ventricular systolic pressure is 63.8 mmHg.  3. Left atrial size was moderately dilated.  4. The mitral valve is normal in structure. No evidence of mitral valve regurgitation.  5. The aortic valve is  tricuspid. There is mild calcification of the aortic valve. There is mild thickening of the aortic valve. Aortic valve regurgitation is not visualized. Aortic valve sclerosis/calcification is present, without any evidence of aortic stenosis.  6. The inferior vena cava  is dilated in size with <50% respiratory variability, suggesting right atrial pressure of 15 mmHg. Comparison(s): No prior Echocardiogram. FINDINGS  Left Ventricle: Left ventricular ejection fraction, by estimation, is 50 to 55%. The left ventricle has low normal function. The left ventricle has no regional wall motion abnormalities. The left ventricular internal cavity size was normal in size. There is mild left ventricular hypertrophy. Left ventricular diastolic parameters are indeterminate. Right Ventricle: The right ventricular size is normal. No increase in right ventricular wall thickness. Right ventricular systolic function is low normal. There is mildly elevated pulmonary artery systolic pressure. The tricuspid regurgitant velocity is 2.39 m/s, and with an assumed right atrial pressure of 15 mmHg, the estimated right ventricular systolic pressure is 94.3 mmHg. Left Atrium: Left atrial size was moderately dilated. Right Atrium: Right atrial size was normal in size. Pericardium: There is no evidence of pericardial effusion. Presence of epicardial fat layer. Mitral Valve: The mitral valve is normal in structure. No evidence of mitral valve regurgitation. Tricuspid Valve: The tricuspid valve is normal in structure. Tricuspid valve regurgitation is trivial. No evidence of tricuspid stenosis. Aortic Valve: The aortic valve is tricuspid. There is mild calcification of the aortic valve. There is mild thickening of the aortic valve. There is mild aortic valve annular calcification. Aortic valve regurgitation is not visualized. Aortic valve sclerosis/calcification is present, without any evidence of aortic stenosis. Pulmonic Valve: The pulmonic valve was  not well visualized. Pulmonic valve regurgitation is not visualized. No evidence of pulmonic stenosis. Aorta: The aortic root and ascending aorta are structurally normal, with no evidence of dilitation. Venous: The inferior vena cava is dilated in size with less than 50% respiratory variability, suggesting right atrial pressure of 15 mmHg. IAS/Shunts: No atrial level shunt detected by color flow Doppler.  LEFT VENTRICLE PLAX 2D LVIDd:         4.00 cm LVIDs:         3.10 cm LV PW:         1.20 cm LV IVS:        1.20 cm LVOT diam:     2.10 cm LV SV:         48 LV SV Index:   22 LVOT Area:     3.46 cm  RIGHT VENTRICLE RV S prime:     5.98 cm/s TAPSE (M-mode): 1.3 cm LEFT ATRIUM              Index        RIGHT ATRIUM           Index LA diam:        3.80 cm  1.73 cm/m   RA Area:     18.30 cm LA Vol (A2C):   84.8 ml  38.57 ml/m  RA Volume:   43.60 ml  19.83 ml/m LA Vol (A4C):   108.0 ml 49.12 ml/m LA Biplane Vol: 101.0 ml 45.94 ml/m  AORTIC VALVE LVOT Vmax:   66.60 cm/s LVOT Vmean:  44.500 cm/s LVOT VTI:    0.138 m  AORTA Ao Root diam: 3.20 cm Ao Asc diam:  3.40 cm TRICUSPID VALVE TR Peak grad:   22.8 mmHg TR Vmax:        239.00 cm/s  SHUNTS Systemic VTI:  0.14 m Systemic Diam: 2.10 cm Rudean Haskell MD Electronically signed by Rudean Haskell MD Signature Date/Time: 06/24/2022/12:19:36 PM    Final    US Venous Img Lower Bilateral (DVT)  Result Date: 06/24/2022 CLINICAL DATA:  81 year old  male with history of pulmonary embolism. Shortness of breath. Evaluate for deep venous thrombosis. EXAM: BILATERAL LOWER EXTREMITY VENOUS DOPPLER ULTRASOUND TECHNIQUE: Gray-scale sonography with graded compression, as well as color Doppler and duplex ultrasound were performed to evaluate the lower extremity deep venous systems from the level of the common femoral vein and including the common femoral, femoral, profunda femoral, popliteal and calf veins including the posterior tibial, peroneal and gastrocnemius  veins when visible. The superficial great saphenous vein was also interrogated. Spectral Doppler was utilized to evaluate flow at rest and with distal augmentation maneuvers in the common femoral, femoral and popliteal veins. COMPARISON:  No priors. FINDINGS: RIGHT LOWER EXTREMITY Innumerable occlusive and nonocclusive thrombi noted throughout the right lower extremity venous system, as evidenced by areas of echogenic material within the vascular lumen, incomplete compression and lack of augmentation. LEFT LOWER EXTREMITY Innumerable occlusive and nonocclusive thrombi throughout the left lower extremity venous system, as evidenced by areas of echogenic material within the vascular lumen, incomplete compression and lack of augmentation. IMPRESSION: Study is positive for extensive occlusive and nonocclusive thrombus throughout the lower extremity venous system bilaterally. These results will be called to the ordering clinician or representative by the Radiologist Assistant, and communication documented in the PACS or [employer]. Electronically Signed   By: Vinnie Langton M.D.   On: 06/24/2022 10:34   CT Head Wo Contrast  Result Date: 06/23/2022 CLINICAL DATA:  AMS EXAM: CT HEAD WITHOUT CONTRAST TECHNIQUE: Contiguous axial images were obtained from the base of the skull through the vertex without intravenous contrast. RADIATION DOSE REDUCTION: This exam was performed according to the departmental dose-optimization program which includes automated exposure control, adjustment of the mA and/or kV according to patient size and/or use of iterative reconstruction technique. COMPARISON:  CT head 11/30/2014 FINDINGS: Brain: Cerebral ventricle sizes are concordant with the degree of cerebral volume loss. Patchy and confluent areas of decreased attenuation are noted throughout the deep and periventricular white matter of the cerebral hemispheres bilaterally, compatible with chronic microvascular ischemic disease.  no evidence of large-territorial acute infarction. No parenchymal hemorrhage. No mass lesion. No extra-axial collection. No mass effect or midline shift. No hydrocephalus. Basilar cisterns are patent. Vascular: No hyperdense vessel. Skull: No acute fracture or focal lesion. Sinuses/Orbits: Paranasal sinuses and mastoid air cells are clear. Bilateral lens replacement. Otherwise the orbits are unremarkable. Other: None. IMPRESSION: No acute intracranial abnormality. Electronically Signed   By: Iven Finn M.D.   On: 06/23/2022 17:13   CT Angio Chest PE W and/or Wo Contrast  Result Date: 06/23/2022 CLINICAL DATA:  cf PE vs PNA EXAM: CT ANGIOGRAPHY CHEST WITH CONTRAST TECHNIQUE: Multidetector CT imaging of the chest was performed using the standard protocol during bolus administration of intravenous contrast. Multiplanar CT image reconstructions and MIPs were obtained to evaluate the vascular anatomy. RADIATION DOSE REDUCTION: This exam was performed according to the departmental dose-optimization program which includes automated exposure control, adjustment of the mA and/or kV according to patient size and/or use of iterative reconstruction technique. CONTRAST:  136m OMNIPAQUE IOHEXOL 350 MG/ML SOLN COMPARISON:  None Available. FINDINGS: Cardiovascular: Satisfactory opacification of the pulmonary arteries to the segmental level. Nonocclusive filling defect of the most distal right pulmonary artery with extension into the proximal segmental right lower and right middle lobe pulmonary arteries. Enlarged right atria. Enlarged left atrium. Associated enlarged right to left ventricular ratio of 1. The main pulmonary artery is normal in caliber. No significant pericardial effusion. The thoracic aorta is normal in caliber.  Moderate to severe atherosclerotic plaque of the thoracic aorta. Four-vessel coronary artery calcifications. Mediastinum/Nodes: No enlarged mediastinal, hilar, or axillary lymph nodes. Thyroid  gland, trachea, and esophagus demonstrate no significant findings. Lungs/Pleura: Respiratory motion artifact with limited evaluation. Mild paraseptal emphysematous changes. Mild interlobular septal wall thickening. Bilateral basilar lower lobe, right greater than left, honeycombing. No focal consolidation. No pulmonary nodule. No pulmonary mass. Trace bilateral pleural effusions. No pneumothorax. Upper Abdomen: No acute abnormality. Musculoskeletal: No chest wall abnormality. No suspicious lytic or blastic osseous lesions. No acute displaced fracture. Multilevel moderate degenerative changes of the spine. Review of the MIP images confirms the above findings. IMPRESSION: 1. Nonocclusive distal right main and proximal segmental right lower and middle lobe pulmonary emboli. Associated enlarged right to left ventricular ratio of 1.2. No pulmonary infarction. 2. Cardiomegaly with mild pulmonary edema and bilateral trace pleural effusions. 3. 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 Consider nonemergent outpatient high-resolution chest CT for further evaluation. 4. Aortic Atherosclerosis (ICD10-I70.0) including four-vessel coronary calcifications. 5.  Emphysema (ICD10-J43.9). These results were called by telephone at the time of interpretation on 06/23/2022 at 5:07 pm to provider Margaretmary Eddy , who verbally acknowledged these results. Electronically Signed   By: Iven Finn M.D.   On: 06/23/2022 17:11   DG Chest Port 1 View  Result Date: 06/23/2022 CLINICAL DATA:  Shortness of breath. Diagnosed with pneumonia 3 weeks ago. Congestion. EXAM: PORTABLE CHEST 1 VIEW COMPARISON:  AP chest 07/24/2015; CT chest 12/28/2009 FINDINGS: There are moderately to markedly decreased lung volumes, limiting evaluation. The cardiac silhouette may be mildly enlarged. Mediastinal contours are grossly within normal limits for low lung volumes. Moderate calcifications within the aortic  arch. Moderate bilateral interstitial thickening appears unchanged from 07/24/2015 prior radiographs, especially given the lower lung volumes on the current exam. There is bibasilar bronchovascular crowding. Evaluation of the lung bases is limited. No large pleural effusion is seen. No pneumothorax. 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 IMPRESSION: 1. Moderately to markedly decreased lung volumes, limiting evaluation. 2. Moderate bilateral interstitial thickening appears not significantly changed from 07/24/2015, especially given the lower lung volumes on the current exam. This is favored represent chronic interstitial lung disease. Electronically Signed   By: Yvonne Kendall M.D.   On: 06/23/2022 14:31    Orson Eva, DO  [facility]  If 7PM-7AM, please contact night-coverage [email or web address] Password [email or web address] 06/24/2022, 2:05 PM   LOS: 1 day

## 2022-06-24 NOTE — Progress Notes (Signed)
Echocardiogram 2D Echocardiogram has been performed.  Oneal Deputy Martino Tompson RDCS 06/24/2022, 11:02 AM

## 2022-06-24 NOTE — Hospital Course (Addendum)
81 year old male with a history of dementia, CKD stage II, obesity, prostate cancer, presenting from [facility] with shortness of breath, cough, and chest congestion.  Due to the patient's encephalopathy, the patient is unable to provide history.  History is obtained from review the medical record and speaking with the patient's daughter.  Unfortunately, the patient's daughter is unable to provide any significant history regarding the patient's current illness other than to say that the nursing facility had told her they have been treating the patient for pneumonia.  Also, she was also notified sometime after Thanksgiving that he had a blood clot in his leg, and the patient was being started on " blood thinners".  She is not aware that he had any scans of his chest.  Nevertheless, EMS was activated secondary to the above symptoms and altered mental status and confusion.  The patient was noted to have oxygen saturation of 76% on room air.  He was placed on nonrebreather with improvement and brought to the emergency department.  Review of his medical record from [facility] shows that the patient was recently started on apixaban for DVT.  It appears that the start date was 06/15/2022.  Interestingly, the patient had been treated for a diagnosis of pneumonia in the past 2 weeks with Augmentin and azithromycin. In the ED, the patient was afebrile and hemodynamically stable albeit with soft blood pressures initially.  He was somewhat agitated was given Ativan x 2 doses and placed on BiPAP.  WBC 8.0, hemoglobin 9.6, platelets 232,000.  Sodium 142, potassium 4.8, bicarbonate 33, serum creatinine 0.89.  AST 32, ALT 16, alk phosphatase 73, total bilirubin 0.9.  Lactic acid 1.7>> 1.9.  EKG showed atrial fibrillation with nonspecific T wave changes.  VBG showed 7.3 5/87/36/48.  CTA chest was positive for PE in the right middle lobe and right lower lobe.  There is mild interlobular septal thickening.  There is no  consolidation.  There is trace bilateral pleural effusions.  RV: LV ratio 1.2.  CT of the brain was negative.  The patient was started on IV heparin.  He was given a dose of IV furosemide 60 mg IV.  He was also given vancomycin, cefepime, azithromycin.  He was admitted for further treatment and evaluation of his respiratory failure.

## 2022-06-24 NOTE — Progress Notes (Signed)
Date and time results received: 06/24/22 1319 (use smartphrase ".now" to insert current time)  Test: PTT Critical Value: >200  Name of Provider Notified: MD Tat  Orders Received? Or Actions Taken?:  Order received to have pharmacy adjust per protocol.

## 2022-06-24 NOTE — Progress Notes (Signed)
[place] for heparin Indication: pulmonary embolus  Heparin Dosing Weight: 89.8 kg  Labs: Recent Labs    06/23/22 1240 06/23/22 1830 06/24/22 0240  HGB 11.6*  --   --   HCT 39.3  --   --   PLT 332  --   --   APTT  --  34  --   LABPROT 15.9*  --   --   INR 1.3*  --   --   HEPARINUNFRC  --   --  >1.10*  CREATININE 0.89  --   --      Assessment: 60 yom presenting with SOB, found to have acute nonocclusive PE with associated enlarged R to L ventricular ratio of 1:2. Pharmacy consulted to dose heparin. Patient is not on anticoagulation PTA. Hg 11.6, plt WNL. No bleed issues reported.  12/16 AM update:  Heparin level supra-therapeutic   Goal of Therapy:  Heparin level 0.3-0.7 units/ml Monitor platelets by anticoagulation protocol: Yes   Plan:  Hold heparin x 1 hr Re-start heparin at 1300 units/hr 1200 heparin level  Narda Bonds, PharmD, [place] Pharmacist Phone: [phone]

## 2022-06-24 NOTE — TOC Progression Note (Signed)
   Transition of Care [facility]) Screening Note   Patient Details  Name: Bradley Roman Date of Birth: 06/20/1941   Transition of Care [facility]) CM/SW Contact:    Boneta Lucks, RN Phone Number: 06/24/2022, 3:50 PM   Transition of Care Department [facility]) has reviewed patient and no TOC needs have been identified at this time. We will continue to monitor patient advancement through interdisciplinary progression rounds. If new patient transition needs arise, please place a TOC consult.   Barriers to Discharge: Continued Medical Work up  Expected Discharge Plan and [place]

## 2022-06-24 NOTE — Progress Notes (Signed)
Responded to nursing call:  nonsustained Vtach Came to bedside to eval patient and reviewed telemetry  Subjective: Pt is more awake and alert than this morning, but confused.  Not answering questions appropriately.  ROS not reliable  Vitals:   06/24/22 1201 06/24/22 1429 06/24/22 1440 06/24/22 1634  BP: 108/68     Pulse: 70  93 91  Resp: 20  (!) 22 (!) 39  Temp: (!) 95.2 F (35.1 C)  (!) 96.8 F (36 C) (!) 97.3 F (36.3 C)  TempSrc:   Rectal   SpO2: 99% 91% 93% 94%  Weight:   103.6 kg   Height:   '5\' 6"'$  (1.676 m)    CV--RRR Lung--bilateral rales, scattered rhonchi Abd--soft+BS/NT   Assessment/Plan: Wide Complex tachycardia -reviewed telemetry>>looks like mostly artifact, longest about 2-3 sec -repeat BMP, Mag -mental status actually better than this morning -BP stable -If sustained, more frequent runs>>metoprolol IV vs amio     Orson Eva, DO [facility]

## 2022-06-24 NOTE — Progress Notes (Signed)
[place] for heparin Indication: pulmonary embolus  Heparin Dosing Weight: 89.8 kg  Labs: Recent Labs    06/23/22 1240 06/23/22 1830 06/24/22 0240 06/24/22 0624 06/24/22 1215 06/24/22 1526  HGB 11.6*  --   --  11.5*  --   --   HCT 39.3  --   --  39.7  --   --   PLT 332  --   --  318  --   --   APTT  --  34  --   --  >200* 128*  LABPROT 15.9*  --   --   --   --   --   INR 1.3*  --   --   --   --   --   HEPARINUNFRC  --   --  >1.10*  --  >1.10*  --   CREATININE 0.89  --   --  0.83  --   --      Assessment: 46 yom presenting with SOB, found to have acute nonocclusive PE with associated enlarged R to L ventricular ratio of 1:2. Pharmacy consulted to dose heparin. Patient is on apixaban prior to admission.  HL >1.10 due to apixaban  APTT >200. Redraw and now APTT 128.  Goal of Therapy:  Heparin level 0.3-0.7 units/ml Monitor platelets by anticoagulation protocol: Yes   Plan:  Hold heparin x 1 hr Re-start heparin at 1100 units/hr Heparin level in 8 hours and daily.  Margot Ables, PharmD Clinical Pharmacist 06/24/2022 4:36 PM

## 2022-06-25 DIAGNOSIS — J90 Pleural effusion, not elsewhere classified: Secondary | ICD-10-CM | POA: Diagnosis not present

## 2022-06-25 DIAGNOSIS — I2699 Other pulmonary embolism without acute cor pulmonale: Secondary | ICD-10-CM | POA: Diagnosis not present

## 2022-06-25 DIAGNOSIS — U071 COVID-19: Secondary | ICD-10-CM | POA: Diagnosis not present

## 2022-06-25 DIAGNOSIS — J9601 Acute respiratory failure with hypoxia: Secondary | ICD-10-CM | POA: Diagnosis not present

## 2022-06-25 LAB — COMPREHENSIVE METABOLIC PANEL
ALT: 17 U/L (ref 0–44)
AST: 26 U/L (ref 15–41)
Albumin: 2.7 g/dL — ABNORMAL LOW (ref 3.5–5.0)
Alkaline Phosphatase: 65 U/L (ref 38–126)
Anion gap: 12 (ref 5–15)
BUN: 24 mg/dL — ABNORMAL HIGH (ref 8–23)
CO2: 33 mmol/L — ABNORMAL HIGH (ref 22–32)
Calcium: 8.5 mg/dL — ABNORMAL LOW (ref 8.9–10.3)
Chloride: 99 mmol/L (ref 98–111)
Creatinine, Ser: 0.98 mg/dL (ref 0.61–1.24)
GFR, Estimated: >60 mL/min (ref >60)
Glucose, Bld: 123 mg/dL — ABNORMAL HIGH (ref 70–99)
Potassium: 3.7 mmol/L (ref 3.5–5.1)
Sodium: 144 mmol/L (ref 135–145)
Total Bilirubin: 1 mg/dL (ref 0.3–1.2)
Total Protein: 7.2 g/dL (ref 6.5–8.1)

## 2022-06-25 LAB — CBC WITH DIFFERENTIAL/PLATELET
Abs Immature Granulocytes: 0.08 10*3/uL — ABNORMAL HIGH (ref 0.00–0.07)
Basophils Absolute: 0 10*3/uL (ref 0.0–0.1)
Basophils Relative: 0 %
Eosinophils Absolute: 0 10*3/uL (ref 0.0–0.5)
Eosinophils Relative: 0 %
HCT: 35.5 % — ABNORMAL LOW (ref 39.0–52.0)
Hemoglobin: 10.7 g/dL — ABNORMAL LOW (ref 13.0–17.0)
Immature Granulocytes: 1 %
Lymphocytes Relative: 10 %
Lymphs Abs: 0.9 10*3/uL (ref 0.7–4.0)
MCH: 31.7 pg (ref 26.0–34.0)
MCHC: 30.1 g/dL (ref 30.0–36.0)
MCV: 105 fL — ABNORMAL HIGH (ref 80.0–100.0)
Monocytes Absolute: 0.7 10*3/uL (ref 0.1–1.0)
Monocytes Relative: 7 %
Neutro Abs: 7.7 10*3/uL (ref 1.7–7.7)
Neutrophils Relative %: 82 %
Platelets: 306 10*3/uL (ref 150–400)
RBC: 3.38 MIL/uL — ABNORMAL LOW (ref 4.22–5.81)
RDW: 18.1 % — ABNORMAL HIGH (ref 11.5–15.5)
WBC: 9.4 10*3/uL (ref 4.0–10.5)
nRBC: 0.3 % — ABNORMAL HIGH (ref 0.0–0.2)

## 2022-06-25 LAB — HEPARIN LEVEL (UNFRACTIONATED)
Heparin Unfractionated: >1.1 IU/mL — ABNORMAL HIGH (ref 0.30–0.70)
Heparin Unfractionated: >1.1 IU/mL — ABNORMAL HIGH (ref 0.30–0.70)

## 2022-06-25 LAB — C-REACTIVE PROTEIN: CRP: 5.3 mg/dL — ABNORMAL HIGH (ref <1.0)

## 2022-06-25 LAB — GLUCOSE, CAPILLARY
Glucose-Capillary: 109 mg/dL — ABNORMAL HIGH (ref 70–99)
Glucose-Capillary: 118 mg/dL — ABNORMAL HIGH (ref 70–99)
Glucose-Capillary: 121 mg/dL — ABNORMAL HIGH (ref 70–99)
Glucose-Capillary: 124 mg/dL — ABNORMAL HIGH (ref 70–99)
Glucose-Capillary: 125 mg/dL — ABNORMAL HIGH (ref 70–99)
Glucose-Capillary: 137 mg/dL — ABNORMAL HIGH (ref 70–99)

## 2022-06-25 LAB — APTT
aPTT: 110 seconds — ABNORMAL HIGH (ref 24–36)
aPTT: 88 seconds — ABNORMAL HIGH (ref 24–36)
aPTT: 89 seconds — ABNORMAL HIGH (ref 24–36)

## 2022-06-25 LAB — PHOSPHORUS: Phosphorus: 2.7 mg/dL (ref 2.5–4.6)

## 2022-06-25 LAB — D-DIMER, QUANTITATIVE: D-Dimer, Quant: 2.43 ug/mL-FEU — ABNORMAL HIGH (ref 0.00–0.50)

## 2022-06-25 LAB — T3, FREE: T3, Free: 1.5 pg/mL — ABNORMAL LOW (ref 2.0–4.4)

## 2022-06-25 LAB — CULTURE, BLOOD (ROUTINE X 2)

## 2022-06-25 LAB — MAGNESIUM: Magnesium: 2 mg/dL (ref 1.7–2.4)

## 2022-06-25 LAB — PROCALCITONIN: Procalcitonin: <0.1 ng/mL

## 2022-06-25 LAB — FERRITIN: Ferritin: 354 ng/mL — ABNORMAL HIGH (ref 24–336)

## 2022-06-25 MED ORDER — APIXABAN 5 MG PO TABS: 5.0000 mg | ORAL_TABLET | Freq: Two times a day (BID) | ORAL | Status: DC

## 2022-06-25 MED ORDER — FUROSEMIDE 10 MG/ML IJ SOLN
40.0000 mg | Freq: Once | INTRAMUSCULAR | Status: AC
  Administered 2022-06-25: 40 mg via INTRAVENOUS
  Filled 2022-06-25: qty 4

## 2022-06-25 MED ORDER — APIXABAN 5 MG PO TABS
10.0000 mg | ORAL_TABLET | Freq: Two times a day (BID) | ORAL | Status: DC
  Administered 2022-06-25 – 2022-06-27 (×5): 10 mg via ORAL
  Filled 2022-06-25 (×5): qty 2

## 2022-06-25 MED ORDER — METHYLPREDNISOLONE SODIUM SUCC 125 MG IJ SOLR
80.0000 mg | Freq: Two times a day (BID) | INTRAMUSCULAR | Status: DC
  Administered 2022-06-25 – 2022-06-27 (×4): 80 mg via INTRAVENOUS
  Filled 2022-06-25 (×4): qty 2

## 2022-06-25 NOTE — Progress Notes (Signed)
ANTICOAGULATION CONSULT NOTE  Pharmacy Consult for heparin Indication: pulmonary embolus  Heparin Dosing Weight: 89.8 kg  Labs: Recent Labs    06/23/22 1240 06/23/22 1830 06/24/22 0240 06/24/22 0624 06/24/22 1215 06/24/22 1526 06/25/22 0000  HGB 11.6*  --   --  11.5*  --   --  10.7*  HCT 39.3  --   --  39.7  --   --  35.5*  PLT 332  --   --  318  --   --  306  APTT  --    < >  --   --  >200* 128* 89*  LABPROT 15.9*  --   --   --   --   --   --   INR 1.3*  --   --   --   --   --   --   HEPARINUNFRC  --   --  >1.10*  --  >1.10*  --  >1.10*  CREATININE 0.89  --   --  0.83  --   --   --    < > = values in this interval not displayed.     Assessment: 48 yom presenting with SOB, found to have acute nonocclusive PE with associated enlarged R to L ventricular ratio of 1:2. Pharmacy consulted to dose heparin. On Apixaban PTA . Hg 11.6, plt WNL. No bleed issues reported.  12/17 AM update:  Heparin level supra-therapeutic   Goal of Therapy:  Heparin level 0.3-0.7 units/ml aPTT 66-102 secs Monitor platelets by anticoagulation protocol: Yes   Plan:  Cont heparin at 1100 units/hr aPTT and heparin level in 8 hours  Narda Bonds, PharmD, [place] Pharmacist Phone: [phone]

## 2022-06-25 NOTE — Progress Notes (Signed)
Patient placed back on BiPAP 16/6 60 FiO2. Patient still has strange breathing pattern resembles cheyne stokes. Patient saturation had dropped to 85 on heated high flow.

## 2022-06-25 NOTE — Progress Notes (Addendum)
[place] for heparin >> apixaban Indication: pulmonary embolus/DVT  Heparin Dosing Weight: 89.8 kg  Labs: Recent Labs    06/23/22 1240 06/23/22 1830 06/24/22 0624 06/24/22 1215 06/24/22 1526 06/25/22 0000 06/25/22 1009  HGB 11.6*  --  11.5*  --   --  10.7*  --   HCT 39.3  --  39.7  --   --  35.5*  --   PLT 332  --  318  --   --  306  --   APTT  --    < >  --  >200* 128* 89* 110*  LABPROT 15.9*  --   --   --   --   --   --   INR 1.3*  --   --   --   --   --   --   HEPARINUNFRC  --    < >  --  >1.10*  --  >1.10* >1.10*  CREATININE 0.89  --  0.83  --   --  0.98  --    < > = values in this interval not displayed.     Assessment: 21 yom presenting with SOB, found to have acute nonocclusive PE with associated enlarged R to L ventricular ratio of 1:2. Pharmacy consulted to transition from heparin to apixaban.. On Apixaban PTA . Hg 11.6, plt WNL.   Goal of Therapy:   Monitor platelets by anticoagulation protocol: Yes   Plan:  Stop heparin infusion Start apixaban 10 mg twice daily x 7 days followed by apixaban 5 mg twice daily.  Margot Ables, PharmD Clinical Pharmacist 06/25/2022 12:40 PM

## 2022-06-25 NOTE — Progress Notes (Signed)
Patient appears improved (sleeping) , breathing patten more regular. Wheezes on rt side, left side diminished to absent. Saturation up to high 90's.

## 2022-06-25 NOTE — Progress Notes (Signed)
RT removed patient from BIPAP and placed on Heated High Flow Nasal Cannula, 80% FIO2 and 25L. Patient SATs 97% HR 81 and RR 25-30 . RT will continue to monitor.

## 2022-06-25 NOTE — Progress Notes (Signed)
PROGRESS NOTE  Bradley Roman UQJ:[id number] DOB: 1940/08/06 DOA: 06/23/2022 PCP: Jani Gravel, MD  Brief History:  81 year old male with a history of dementia, CKD stage II, obesity, prostate cancer, presenting from [facility] with shortness of breath, cough, and chest congestion.  Due to the patient's encephalopathy, the patient is unable to provide history.  History is obtained from review the medical record and speaking with the patient's daughter.  Unfortunately, the patient's daughter is unable to provide any significant history regarding the patient's current illness other than to say that the nursing facility had told her they have been treating the patient for pneumonia.  Also, she was also notified sometime after Thanksgiving that he had a blood clot in his leg, and the patient was being started on " blood thinners".  She is not aware that he had any scans of his chest.  Nevertheless, EMS was activated secondary to the above symptoms and altered mental status and confusion.  The patient was noted to have oxygen saturation of 76% on room air.  He was placed on nonrebreather with improvement and brought to the emergency department.  Review of his medical record from [facility] shows that the patient was recently started on apixaban for DVT.  It appears that the start date was 06/15/2022.  Interestingly, the patient had been treated for a diagnosis of pneumonia in the past 2 weeks with Augmentin and azithromycin. In the ED, the patient was afebrile and hemodynamically stable albeit with soft blood pressures initially.  He was somewhat agitated was given Ativan x 2 doses and placed on BiPAP.  WBC 8.0, hemoglobin 9.6, platelets 232,000.  Sodium 142, potassium 4.8, bicarbonate 33, serum creatinine 0.89.  AST 32, ALT 16, alk phosphatase 73, total bilirubin 0.9.  Lactic acid 1.7>> 1.9.  EKG showed atrial fibrillation with nonspecific T wave changes.  VBG showed 7.3 5/87/36/48.  CTA chest was  positive for PE in the right middle lobe and right lower lobe.  There is mild interlobular septal thickening.  There is no consolidation.  There is trace bilateral pleural effusions.  RV: LV ratio 1.2.  CT of the brain was negative.  The patient was started on IV heparin.  He was given a dose of IV furosemide 60 mg IV.  He was also given vancomycin, cefepime, azithromycin.  He was admitted for further treatment and evaluation of his respiratory failure.   Assessment/Plan: Acute respiratory failure with hypoxia and hypercarbia -Secondary to COVID-19 pneumonia in the setting of underlying ILD pulmonary embolus, and pulmonary edema -Wean off BiPAP as tolerated -Repeat ABG 7.51/63/60/50 (0.5) -12/17--now on heated high flow @ 30L, 100% FiO2>>tolerating better than BiPAP   COVID-19 pneumonia -CRP 6.4>> -Ferritin 180>>354 -D-dimer 2.76>>2.43 -Continue IV Solu-Medrol -Continue remdesivir -Vitamin C and zinc once are alert enough to take p.o. -PCT <0.10   Acute pulmonary embolus/leg DVT -06/23/2022 CTA chest--+PE RML, RLL -After review of the records, suspect what may have been felt to be pneumonia initially was likely PE that may have been present -Therefore, do not feel that this is failure of the patient's apixaban -Rather that his respiratory failure decompensation is attributable to COVID-19 and fluid overload on top of his suspected existing PE -12/16 Echo--EF 50-55%, no WMA, low normal RVF, PASP 37.9 -12/16 [place] legs>>bilateral DVT   Acute diastolic CHF -38/93 Echo--EF 50-55%, no WMA, low normal RVF, PASP 37.9 -There are signs of volume overload -Appears that the patient was  given intramuscular dose of furosemide at [facility] -given lasix IV x 2 this hospitalization -appears more euvolemic -obtain ReDS if possible   Atrial fibrillation, type unspecified -Personally reviewed EKG--atrial fibrillation, nonspecific T wave change -Reviewed telemetry--rate controlled A-fib -Already on  anticoagulation with IV heparin>>apixaban   Interstitial lung disease -There is evidence on the patient's CTA chest he has underlying existing ILD as there is lower lobe honeycombing noted -This certainly will increase the patient's morbidity given his acute medical illnesses   Major neurocognitive disorder -Restart Aricept once able to take p.o. -Patient was on Depakote 250 mg twice daily at [facility]   Acute metabolic encephalopathy -Multifactorial including respiratory failure, infectious process -CT brain negative -UA negative for pyuria   CKD stage II -Baseline creatinine 0.8-1.1 -Monitor with diuresis   Morbid obesity -BMI 40.35   Hyperglycemia -Check hemoglobin A1c--pending -Anticipate elevated CBGs secondary to steroids -NovoLog sliding scale   GOC -DNR confirmed with the patient's daughter -Daughter stated that she would consider transition to comfort care if there is no improvement in his clinical condition     Family Communication: daughter updated   Consultants:  none   Code Status: DNR   DVT Prophylaxis:  apixaban     Procedures: As Listed in Progress Note Above   Antibiotics: Vanc 12/15>>12/17 Cefepime 12/15>>12/17 Azithro 12/15     The patient is critically ill with multiple organ systems failure and requires high complexity decision making for assessment and support, frequent evaluation and titration of therapies, application of advanced monitoring technologies and extensive interpretation of multiple databases.  Critical care time - 45 mins.   Subjective: Pt is awake.  Speaks incomprehensibly.  No vomiting, resp distress, diarrhea  Objective: Vitals:   06/25/22 0900 06/25/22 0919 06/25/22 0940 06/25/22 1123  BP: (!) 134/40     Pulse: 84     Resp: (!) 34     Temp:    97.6 F (36.4 C)  TempSrc:    Axillary  SpO2: 100% 97% 94%   Weight:      Height:        Intake/Output Summary (Last 24 hours) at 06/25/2022 1221 Last data filed  at 06/25/2022 0600 Gross per 24 hour  Intake 352.04 ml  Output 1400 ml  Net -1047.96 ml   Weight change: -9.799 kg Exam:  General:  Pt is alert, does not follow commands appropriately, not in acute distress HEENT: No icterus, No thrush, No neck mass, [place]/AT Cardiovascular: IRRR, S1/S2, no rubs, no gallops Respiratory: bibasilar rales. No wheeze Abdomen: Soft/+BS, non tender, non distended, no guarding Extremities: R>LLE edema, No lymphangitis, No petechiae, No rashes, no synovitis   Data Reviewed: I have personally reviewed following labs and imaging studies Basic Metabolic Panel: Recent Labs  Lab 06/23/22 1240 06/24/22 0624 06/25/22 0000  NA 142 144 144  K 4.8 4.3 3.7  CL 100 99 99  CO2 33* 37* 33*  GLUCOSE 105* 161* 123*  BUN 13 16 24*  CREATININE 0.89 0.83 0.98  CALCIUM 8.4* 8.4* 8.5*  MG  --  2.1 2.0  PHOS  --  6.0* 2.7   Liver Function Tests: Recent Labs  Lab 06/23/22 1240 06/24/22 0624 06/25/22 0000  AST 32 22 26  ALT _0 ALKPHOS 73 76 65  BILITOT 0.9 0.6 1.0  PROT 7.7 7.6 7.2  ALBUMIN 2.7* 2.7* 2.7*   No results for input(s): "LIPASE", "AMYLASE" in the last 168 hours. No results for input(s): "AMMONIA" in the last  168 hours. Coagulation Profile: Recent Labs  Lab 06/23/22 1240  INR 1.3*   CBC: Recent Labs  Lab 06/23/22 1240 06/24/22 0624 06/25/22 0000  WBC 8.0 7.8 9.4  NEUTROABS 5.4 6.8 7.7  HGB 11.6* 11.5* 10.7*  HCT 39.3 39.7 35.5*  MCV 107.1* 108.8* 105.0*  PLT 332 318 306   Cardiac Enzymes: No results for input(s): "CKTOTAL", "CKMB", "CKMBINDEX", "TROPONINI" in the last 168 hours. BNP: Invalid input(s): "POCBNP" CBG: Recent Labs  Lab 06/24/22 2024 06/25/22 0004 06/25/22 0631 06/25/22 0724 06/25/22 1120  GLUCAP 89 109* 137* 118* 124*   HbA1C: No results for input(s): "HGBA1C" in the last 72 hours. Urine analysis:    Component Value Date/Time   COLORURINE COLORLESS (A) 06/23/2022 1400   APPEARANCEUR CLEAR  06/23/2022 1400   LABSPEC 1.005 06/23/2022 1400   PHURINE 5.0 06/23/2022 1400   GLUCOSEU NEGATIVE 06/23/2022 1400   HGBUR SMALL (A) 06/23/2022 1400   BILIRUBINUR NEGATIVE 06/23/2022 1400   KETONESUR NEGATIVE 06/23/2022 1400   PROTEINUR NEGATIVE 06/23/2022 1400   UROBILINOGEN 0.2 12/22/2009 2127   NITRITE NEGATIVE 06/23/2022 1400   LEUKOCYTESUR NEGATIVE 06/23/2022 1400   Sepsis Labs: _0 (procalcitonin:4,lacticidven:4) ) Recent Results (from the past 240 hour(s))  Culture, blood (Routine x 2)     Status: None (Preliminary result)   Collection Time: 06/23/22 12:40 PM   Specimen: BLOOD  Result Value Ref Range Status   Specimen Description BLOOD  Final   Special Requests   Final    RIGHT ANTECUBITAL BOTTLES DRAWN AEROBIC AND ANAEROBIC Blood Culture adequate volume   Culture   Final    NO GROWTH 2 DAYS Performed at [facility], [address], [place], [place] [address]    Report Status PENDING  Incomplete  Culture, blood (Routine x 2)     Status: None (Preliminary result)   Collection Time: 06/23/22  2:47 PM   Specimen: BLOOD  Result Value Ref Range Status   Specimen Description BLOOD BLOOD LEFT HAND  Final   Special Requests   Final    BOTTLES DRAWN AEROBIC AND ANAEROBIC Blood Culture adequate volume BLOOD LEFT HAND   Culture   Final    NO GROWTH 2 DAYS Performed at [facility] [address] [place], [place] [address]    Report Status PENDING  Incomplete  Resp panel by RT-PCR (RSV, Flu A&B, Covid) Anterior Nasal Swab     Status: Abnormal   Collection Time: 06/23/22  3:19 PM   Specimen: Anterior Nasal Swab  Result Value Ref Range Status   SARS Coronavirus 2 by RT PCR POSITIVE (A) NEGATIVE Final    Comment: (NOTE) SARS-CoV-2 target nucleic acids are DETECTED.  The SARS-CoV-2 RNA is generally detectable in upper respiratory specimens during the acute phase of infection. Positive results are indicative of the presence of the identified virus, but do not rule out  bacterial infection or co-infection with other pathogens not detected by the test. Clinical correlation with patient history and other diagnostic information is necessary to determine patient infection status. The expected result is Negative.  Fact Sheet for Patients: EntrepreneurPulse.com.au  Fact Sheet for Healthcare Providers: IncredibleEmployment.be  This test is not yet approved or cleared by the [place] FDA and  has been authorized for detection and/or diagnosis of SARS-CoV-2 by FDA under an Emergency Use Authorization (EUA).  This EUA will remain in effect (meaning this test can be used) for the duration of  the COVID-19 declaration under Section 564(b)(1) of the A ct, 21 U.S.C. section  360bbb-3(b)(1), unless the authorization is terminated or revoked sooner.     Influenza A by PCR NEGATIVE NEGATIVE Final   Influenza B by PCR NEGATIVE NEGATIVE Final    Comment: (NOTE) The Xpert Xpress SARS-CoV-2/FLU/RSV plus assay is intended as an aid in the diagnosis of influenza from Nasopharyngeal swab specimens and should not be used as a sole basis for treatment. Nasal washings and aspirates are unacceptable for Xpert Xpress SARS-CoV-2/FLU/RSV testing.  Fact Sheet for Patients: EntrepreneurPulse.com.au  Fact Sheet for Healthcare Providers: IncredibleEmployment.be  This test is not yet approved or cleared by the [place] FDA and has been authorized for detection and/or diagnosis of SARS-CoV-2 by FDA under an Emergency Use Authorization (EUA). This EUA will remain in effect (meaning this test can be used) for the duration of the COVID-19 declaration under Section 564(b)(1) of the Act, 21 U.S.C. section 360bbb-3(b)(1), unless the authorization is terminated or revoked.     Resp Syncytial Virus by PCR NEGATIVE NEGATIVE Final    Comment: (NOTE) Fact Sheet for  Patients: EntrepreneurPulse.com.au  Fact Sheet for Healthcare Providers: IncredibleEmployment.be  This test is not yet approved or cleared by the [place] FDA and has been authorized for detection and/or diagnosis of SARS-CoV-2 by FDA under an Emergency Use Authorization (EUA). This EUA will remain in effect (meaning this test can be used) for the duration of the COVID-19 declaration under Section 564(b)(1) of the Act, 21 U.S.C. section 360bbb-3(b)(1), unless the authorization is terminated or revoked.  Performed at [facility], [address], [place], [place] [address]   MRSA Next Gen by PCR, Nasal     Status: None   Collection Time: 06/24/22  5:39 PM   Specimen: Nasal Mucosa; Nasal Swab  Result Value Ref Range Status   MRSA by PCR Next Gen NOT DETECTED NOT DETECTED Final    Comment: (NOTE) The GeneXpert MRSA Assay (FDA approved for NASAL specimens only), is one component of a comprehensive MRSA colonization surveillance program. It is not intended to diagnose MRSA infection nor to guide or monitor treatment for MRSA infections. Test performance is not FDA approved in patients less than 60 years old. Performed at [facility], [address], [place], [place] [address]      Scheduled Meds:  apixaban  10 mg Oral BID   Followed by   Derrill Memo ON 07/02/2022] apixaban  5 mg Oral BID   Chlorhexidine Gluconate Cloth  6 each Topical Daily   donepezil  10 mg Oral QHS   insulin aspart  0-9 Units Subcutaneous Q4H   ipratropium-albuterol  3 mL Nebulization Q6H   methylPREDNISolone (SOLU-MEDROL) injection  80 mg Intravenous Q12H   pantoprazole (PROTONIX) IV  40 mg Intravenous QHS   Continuous Infusions:  ceFEPime (MAXIPIME) IV 2 g (06/25/22 0820)   remdesivir 100 mg in sodium chloride 0.9 % 100 mL IVPB      Procedures/Studies: ECHOCARDIOGRAM COMPLETE  Result Date: 06/24/2022    ECHOCARDIOGRAM REPORT   Patient Name:   BERN FARE Date of  Exam: 06/24/2022 Medical Rec #:  [id number]      Height:       66.0 in Accession #:    [id number]     Weight:       250.0 lb Date of Birth:  03/12/1941      BSA:          2.199 m Patient Age:    16 years       BP:  102/54 mmHg Patient Gender: M              HR:           71 bpm. Exam Location:  [facility] Procedure: 2D Echo, Color Doppler and Cardiac Doppler Indications:    I26.02 Pulmonary embolus  History:        Patient has no prior history of Echocardiogram examinations.  Sonographer:    Raquel Sarna Senior RDCS Referring Phys: [id number] OLADAPO ADEFESO  Sonographer Comments: Technically difficult study due to lung interference; COVID+ on bipap IMPRESSIONS  1. Left ventricular ejection fraction, by estimation, is 50 to 55%. The left ventricle has low normal function. The left ventricle has no regional wall motion abnormalities. There is mild left ventricular hypertrophy. Left ventricular diastolic parameters are indeterminate.  2. Right ventricular systolic function is low normal. The right ventricular size is normal. There is mildly elevated pulmonary artery systolic pressure. The estimated right ventricular systolic pressure is 32.2 mmHg.  3. Left atrial size was moderately dilated.  4. The mitral valve is normal in structure. No evidence of mitral valve regurgitation.  5. The aortic valve is tricuspid. There is mild calcification of the aortic valve. There is mild thickening of the aortic valve. Aortic valve regurgitation is not visualized. Aortic valve sclerosis/calcification is present, without any evidence of aortic stenosis.  6. The inferior vena cava is dilated in size with <50% respiratory variability, suggesting right atrial pressure of 15 mmHg. Comparison(s): No prior Echocardiogram. FINDINGS  Left Ventricle: Left ventricular ejection fraction, by estimation, is 50 to 55%. The left ventricle has low normal function. The left ventricle has no regional wall motion abnormalities. The left ventricular  internal cavity size was normal in size. There is mild left ventricular hypertrophy. Left ventricular diastolic parameters are indeterminate. Right Ventricle: The right ventricular size is normal. No increase in right ventricular wall thickness. Right ventricular systolic function is low normal. There is mildly elevated pulmonary artery systolic pressure. The tricuspid regurgitant velocity is 2.39 m/s, and with an assumed right atrial pressure of 15 mmHg, the estimated right ventricular systolic pressure is 02.5 mmHg. Left Atrium: Left atrial size was moderately dilated. Right Atrium: Right atrial size was normal in size. Pericardium: There is no evidence of pericardial effusion. Presence of epicardial fat layer. Mitral Valve: The mitral valve is normal in structure. No evidence of mitral valve regurgitation. Tricuspid Valve: The tricuspid valve is normal in structure. Tricuspid valve regurgitation is trivial. No evidence of tricuspid stenosis. Aortic Valve: The aortic valve is tricuspid. There is mild calcification of the aortic valve. There is mild thickening of the aortic valve. There is mild aortic valve annular calcification. Aortic valve regurgitation is not visualized. Aortic valve sclerosis/calcification is present, without any evidence of aortic stenosis. Pulmonic Valve: The pulmonic valve was not well visualized. Pulmonic valve regurgitation is not visualized. No evidence of pulmonic stenosis. Aorta: The aortic root and ascending aorta are structurally normal, with no evidence of dilitation. Venous: The inferior vena cava is dilated in size with less than 50% respiratory variability, suggesting right atrial pressure of 15 mmHg. IAS/Shunts: No atrial level shunt detected by color flow Doppler.  LEFT VENTRICLE PLAX 2D LVIDd:         4.00 cm LVIDs:         3.10 cm LV PW:         1.20 cm LV IVS:        1.20 cm LVOT diam:     2.10 cm  LV SV:         48 LV SV Index:   22 LVOT Area:     3.46 cm  RIGHT VENTRICLE  RV S prime:     5.98 cm/s TAPSE (M-mode): 1.3 cm LEFT ATRIUM              Index        RIGHT ATRIUM           Index LA diam:        3.80 cm  1.73 cm/m   RA Area:     18.30 cm LA Vol (A2C):   84.8 ml  38.57 ml/m  RA Volume:   43.60 ml  19.83 ml/m LA Vol (A4C):   108.0 ml 49.12 ml/m LA Biplane Vol: 101.0 ml 45.94 ml/m  AORTIC VALVE LVOT Vmax:   66.60 cm/s LVOT Vmean:  44.500 cm/s LVOT VTI:    0.138 m  AORTA Ao Root diam: 3.20 cm Ao Asc diam:  3.40 cm TRICUSPID VALVE TR Peak grad:   22.8 mmHg TR Vmax:        239.00 cm/s  SHUNTS Systemic VTI:  0.14 m Systemic Diam: 2.10 cm Rudean Haskell MD Electronically signed by Rudean Haskell MD Signature Date/Time: 06/24/2022/12:19:36 PM    Final    US Venous Img Lower Bilateral (DVT)  Result Date: 06/24/2022 CLINICAL DATA:  80 year old male with history of pulmonary embolism. Shortness of breath. Evaluate for deep venous thrombosis. EXAM: BILATERAL LOWER EXTREMITY VENOUS DOPPLER ULTRASOUND TECHNIQUE: Gray-scale sonography with graded compression, as well as color Doppler and duplex ultrasound were performed to evaluate the lower extremity deep venous systems from the level of the common femoral vein and including the common femoral, femoral, profunda femoral, popliteal and calf veins including the posterior tibial, peroneal and gastrocnemius veins when visible. The superficial great saphenous vein was also interrogated. Spectral Doppler was utilized to evaluate flow at rest and with distal augmentation maneuvers in the common femoral, femoral and popliteal veins. COMPARISON:  No priors. FINDINGS: RIGHT LOWER EXTREMITY Innumerable occlusive and nonocclusive thrombi noted throughout the right lower extremity venous system, as evidenced by areas of echogenic material within the vascular lumen, incomplete compression and lack of augmentation. LEFT LOWER EXTREMITY Innumerable occlusive and nonocclusive thrombi throughout the left lower extremity venous system, as  evidenced by areas of echogenic material within the vascular lumen, incomplete compression and lack of augmentation. IMPRESSION: Study is positive for extensive occlusive and nonocclusive thrombus throughout the lower extremity venous system bilaterally. These results will be called to the ordering clinician or representative by the Radiologist Assistant, and communication documented in the PACS or [employer]. Electronically Signed   By: Vinnie Langton M.D.   On: 06/24/2022 10:34   CT Head Wo Contrast  Result Date: 06/23/2022 CLINICAL DATA:  AMS EXAM: CT HEAD WITHOUT CONTRAST TECHNIQUE: Contiguous axial images were obtained from the base of the skull through the vertex without intravenous contrast. RADIATION DOSE REDUCTION: This exam was performed according to the departmental dose-optimization program which includes automated exposure control, adjustment of the mA and/or kV according to patient size and/or use of iterative reconstruction technique. COMPARISON:  CT head 11/30/2014 FINDINGS: Brain: Cerebral ventricle sizes are concordant with the degree of cerebral volume loss. Patchy and confluent areas of decreased attenuation are noted throughout the deep and periventricular white matter of the cerebral hemispheres bilaterally, compatible with chronic microvascular ischemic disease. no evidence of large-territorial acute infarction. No parenchymal hemorrhage. No mass lesion. No extra-axial collection.  No mass effect or midline shift. No hydrocephalus. Basilar cisterns are patent. Vascular: No hyperdense vessel. Skull: No acute fracture or focal lesion. Sinuses/Orbits: Paranasal sinuses and mastoid air cells are clear. Bilateral lens replacement. Otherwise the orbits are unremarkable. Other: None. IMPRESSION: No acute intracranial abnormality. Electronically Signed   By: Iven Finn M.D.   On: 06/23/2022 17:13   CT Angio Chest PE W and/or Wo Contrast  Result Date: 06/23/2022 CLINICAL DATA:  cf  PE vs PNA EXAM: CT ANGIOGRAPHY CHEST WITH CONTRAST TECHNIQUE: Multidetector CT imaging of the chest was performed using the standard protocol during bolus administration of intravenous contrast. Multiplanar CT image reconstructions and MIPs were obtained to evaluate the vascular anatomy. RADIATION DOSE REDUCTION: This exam was performed according to the departmental dose-optimization program which includes automated exposure control, adjustment of the mA and/or kV according to patient size and/or use of iterative reconstruction technique. CONTRAST:  141m OMNIPAQUE IOHEXOL 350 MG/ML SOLN COMPARISON:  None Available. FINDINGS: Cardiovascular: Satisfactory opacification of the pulmonary arteries to the segmental level. Nonocclusive filling defect of the most distal right pulmonary artery with extension into the proximal segmental right lower and right middle lobe pulmonary arteries. Enlarged right atria. Enlarged left atrium. Associated enlarged right to left ventricular ratio of 1. The main pulmonary artery is normal in caliber. No significant pericardial effusion. The thoracic aorta is normal in caliber. Moderate to severe atherosclerotic plaque of the thoracic aorta. Four-vessel coronary artery calcifications. Mediastinum/Nodes: No enlarged mediastinal, hilar, or axillary lymph nodes. Thyroid gland, trachea, and esophagus demonstrate no significant findings. Lungs/Pleura: Respiratory motion artifact with limited evaluation. Mild paraseptal emphysematous changes. Mild interlobular septal wall thickening. Bilateral basilar lower lobe, right greater than left, honeycombing. No focal consolidation. No pulmonary nodule. No pulmonary mass. Trace bilateral pleural effusions. No pneumothorax. Upper Abdomen: No acute abnormality. Musculoskeletal: No chest wall abnormality. No suspicious lytic or blastic osseous lesions. No acute displaced fracture. Multilevel moderate degenerative changes of the spine. Review of the MIP  images confirms the above findings. IMPRESSION: 1. Nonocclusive distal right main and proximal segmental right lower and middle lobe pulmonary emboli. Associated enlarged right to left ventricular ratio of 1.2. No pulmonary infarction. 2. Cardiomegaly with mild pulmonary edema and bilateral trace pleural effusions. 3. 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 Consider nonemergent outpatient high-resolution chest CT for further evaluation. 4. Aortic Atherosclerosis (ICD10-I70.0) including four-vessel coronary calcifications. 5.  Emphysema (ICD10-J43.9). These results were called by telephone at the time of interpretation on 06/23/2022 at 5:07 pm to provider RMargaretmary Eddy, who verbally acknowledged these results. Electronically Signed   By: MIven FinnM.D.   On: 06/23/2022 17:11   DG Chest Port 1 View  Result Date: 06/23/2022 CLINICAL DATA:  Shortness of breath. Diagnosed with pneumonia 3 weeks ago. Congestion. EXAM: PORTABLE CHEST 1 VIEW COMPARISON:  AP chest 07/24/2015; CT chest 12/28/2009 FINDINGS: There are moderately to markedly decreased lung volumes, limiting evaluation. The cardiac silhouette may be mildly enlarged. Mediastinal contours are grossly within normal limits for low lung volumes. Moderate calcifications within the aortic arch. Moderate bilateral interstitial thickening appears unchanged from 07/24/2015 prior radiographs, especially given the lower lung volumes on the current exam. There is bibasilar bronchovascular crowding. Evaluation of the lung bases is limited. No large pleural effusion is seen. No pneumothorax. 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 IMPRESSION: 1. Moderately to markedly decreased lung volumes, limiting evaluation. 2. Moderate bilateral interstitial thickening appears not significantly changed from 07/24/2015, especially given  the lower lung volumes on the current exam. This is  favored represent chronic interstitial lung disease. Electronically Signed   By: Yvonne Kendall M.D.   On: 06/23/2022 14:31    Orson Eva, DO  [facility]  If 7PM-7AM, please contact night-coverage [email or web address] Password [email or web address] 06/25/2022, 12:21 PM   LOS: 2 days

## 2022-06-26 DIAGNOSIS — I2699 Other pulmonary embolism without acute cor pulmonale: Secondary | ICD-10-CM | POA: Diagnosis not present

## 2022-06-26 DIAGNOSIS — U071 COVID-19: Secondary | ICD-10-CM | POA: Diagnosis not present

## 2022-06-26 DIAGNOSIS — J9601 Acute respiratory failure with hypoxia: Secondary | ICD-10-CM | POA: Diagnosis not present

## 2022-06-26 DIAGNOSIS — J1282 Pneumonia due to coronavirus disease 2019: Secondary | ICD-10-CM | POA: Diagnosis not present

## 2022-06-26 LAB — CBC WITH DIFFERENTIAL/PLATELET
Abs Immature Granulocytes: 0.12 10*3/uL — ABNORMAL HIGH (ref 0.00–0.07)
Basophils Absolute: 0 10*3/uL (ref 0.0–0.1)
Basophils Relative: 0 %
Eosinophils Absolute: 0 10*3/uL (ref 0.0–0.5)
Eosinophils Relative: 0 %
HCT: 34.1 % — ABNORMAL LOW (ref 39.0–52.0)
Hemoglobin: 10.3 g/dL — ABNORMAL LOW (ref 13.0–17.0)
Immature Granulocytes: 1 %
Lymphocytes Relative: 8 %
Lymphs Abs: 0.9 10*3/uL (ref 0.7–4.0)
MCH: 31.7 pg (ref 26.0–34.0)
MCHC: 30.2 g/dL (ref 30.0–36.0)
MCV: 104.9 fL — ABNORMAL HIGH (ref 80.0–100.0)
Monocytes Absolute: 0.5 10*3/uL (ref 0.1–1.0)
Monocytes Relative: 5 %
Neutro Abs: 8.8 10*3/uL — ABNORMAL HIGH (ref 1.7–7.7)
Neutrophils Relative %: 86 %
Platelets: 274 10*3/uL (ref 150–400)
RBC: 3.25 MIL/uL — ABNORMAL LOW (ref 4.22–5.81)
RDW: 18.8 % — ABNORMAL HIGH (ref 11.5–15.5)
WBC: 10.4 10*3/uL (ref 4.0–10.5)
nRBC: 0.4 % — ABNORMAL HIGH (ref 0.0–0.2)

## 2022-06-26 LAB — COMPREHENSIVE METABOLIC PANEL
ALT: 18 U/L (ref 0–44)
AST: 22 U/L (ref 15–41)
Albumin: 2.6 g/dL — ABNORMAL LOW (ref 3.5–5.0)
Alkaline Phosphatase: 62 U/L (ref 38–126)
Anion gap: 8 (ref 5–15)
BUN: 31 mg/dL — ABNORMAL HIGH (ref 8–23)
CO2: 38 mmol/L — ABNORMAL HIGH (ref 22–32)
Calcium: 8.8 mg/dL — ABNORMAL LOW (ref 8.9–10.3)
Chloride: 99 mmol/L (ref 98–111)
Creatinine, Ser: 1.05 mg/dL (ref 0.61–1.24)
GFR, Estimated: >60 mL/min (ref >60)
Glucose, Bld: 126 mg/dL — ABNORMAL HIGH (ref 70–99)
Potassium: 3.7 mmol/L (ref 3.5–5.1)
Sodium: 145 mmol/L (ref 135–145)
Total Bilirubin: 0.7 mg/dL (ref 0.3–1.2)
Total Protein: 6.9 g/dL (ref 6.5–8.1)

## 2022-06-26 LAB — GLUCOSE, CAPILLARY
Glucose-Capillary: 115 mg/dL — ABNORMAL HIGH (ref 70–99)
Glucose-Capillary: 121 mg/dL — ABNORMAL HIGH (ref 70–99)
Glucose-Capillary: 122 mg/dL — ABNORMAL HIGH (ref 70–99)
Glucose-Capillary: 130 mg/dL — ABNORMAL HIGH (ref 70–99)
Glucose-Capillary: 140 mg/dL — ABNORMAL HIGH (ref 70–99)
Glucose-Capillary: 147 mg/dL — ABNORMAL HIGH (ref 70–99)

## 2022-06-26 LAB — D-DIMER, QUANTITATIVE: D-Dimer, Quant: 2.27 ug/mL-FEU — ABNORMAL HIGH (ref 0.00–0.50)

## 2022-06-26 LAB — CULTURE, BLOOD (ROUTINE X 2)

## 2022-06-26 LAB — C-REACTIVE PROTEIN: CRP: 4.1 mg/dL — ABNORMAL HIGH (ref <1.0)

## 2022-06-26 LAB — MAGNESIUM: Magnesium: 2.4 mg/dL (ref 1.7–2.4)

## 2022-06-26 LAB — FERRITIN: Ferritin: 396 ng/mL — ABNORMAL HIGH (ref 24–336)

## 2022-06-26 LAB — PHOSPHORUS: Phosphorus: 2.7 mg/dL (ref 2.5–4.6)

## 2022-06-26 NOTE — Progress Notes (Signed)
SLP eval placed for patient as he has profuse coughing spells after giving thin liquids to drink such as juice or water. Patient took his pills well, (crushed in applesauce). Patient may need thickened liquids or a different type diet. Will continue to monitor.

## 2022-06-26 NOTE — Progress Notes (Signed)
PROGRESS NOTE  Bradley Roman EXH:[id number] DOB: 1940-07-18 DOA: 06/23/2022 PCP: Jani Gravel, MD  Brief History:  81 year old male with a history of dementia, CKD stage II, obesity, prostate cancer, presenting from [facility] with shortness of breath, cough, and chest congestion.  Due to the patient's encephalopathy, the patient is unable to provide history.  History is obtained from review the medical record and speaking with the patient's daughter.  Unfortunately, the patient's daughter is unable to provide any significant history regarding the patient's current illness other than to say that the nursing facility had told her they have been treating the patient for pneumonia.  Also, she was also notified sometime after Thanksgiving that he had a blood clot in his leg, and the patient was being started on " blood thinners".  She is not aware that he had any scans of his chest.  Nevertheless, EMS was activated secondary to the above symptoms and altered mental status and confusion.  The patient was noted to have oxygen saturation of 76% on room air.  He was placed on nonrebreather with improvement and brought to the emergency department.  Review of his medical record from [facility] shows that the patient was recently started on apixaban for DVT.  It appears that the start date was 06/15/2022.  Interestingly, the patient had been treated for a diagnosis of pneumonia in the past 2 weeks with Augmentin and azithromycin. In the ED, the patient was afebrile and hemodynamically stable albeit with soft blood pressures initially.  He was somewhat agitated was given Ativan x 2 doses and placed on BiPAP.  WBC 8.0, hemoglobin 9.6, platelets 232,000.  Sodium 142, potassium 4.8, bicarbonate 33, serum creatinine 0.89.  AST 32, ALT 16, alk phosphatase 73, total bilirubin 0.9.  Lactic acid 1.7>> 1.9.  EKG showed atrial fibrillation with nonspecific T wave changes.  VBG showed 7.3 5/87/36/48.  CTA chest was  positive for PE in the right middle lobe and right lower lobe.  There is mild interlobular septal thickening.  There is no consolidation.  There is trace bilateral pleural effusions.  RV: LV ratio 1.2.  CT of the brain was negative.  The patient was started on IV heparin.  He was given a dose of IV furosemide 60 mg IV.  He was also given vancomycin, cefepime, azithromycin.  He was admitted for further treatment and evaluation of his respiratory failure.   Assessment/Plan:  Acute respiratory failure with hypoxia and hypercarbia -Secondary to COVID-19 pneumonia in the setting of underlying ILD pulmonary embolus, and pulmonary edema -Wean off BiPAP as tolerated -Repeat ABG 7.51/63/60/50 (0.5) -12/17--now on heated high flow @ 30L, 100% FiO2>>tolerating better than BiPAP -12/18--20L, 80% with saturation 99-100%   COVID-19 pneumonia -CRP 6.4>>5.3>>4.1 -Ferritin 180>>354>>396 -D-dimer 2.76>>2.43>>2.27 -Continue IV Solu-Medrol -Continue remdesivir -Vitamin C and zinc once are alert enough to take p.o. -PCT <0.10   Acute pulmonary embolus/leg DVT -06/23/2022 CTA chest--+PE RML, RLL -After review of the records, suspect what may have been felt to be pneumonia initially was likely PE that may have been present -Therefore, do not feel that this is failure of the patient's apixaban -Rather that his respiratory failure decompensation is attributable to COVID-19 and fluid overload on top of his suspected existing PE -12/16 Echo--EF 50-55%, no WMA, low normal RVF, PASP 37.9 -12/16 [place] legs>>bilateral DVT   Acute diastolic CHF -38/10 Echo--EF 50-55%, no WMA, low normal RVF, PASP 37.9 -There are signs of volume  overload -Appears that the patient was given intramuscular dose of furosemide at [facility] -given lasix IV x 2 this hospitalization -appears more euvolemic now -obtain ReDS = 40 before lasix on 12/17   Atrial fibrillation, type unspecified -Personally reviewed EKG--atrial fibrillation,  nonspecific T wave change -Reviewed telemetry--rate controlled A-fib -Already on anticoagulation with IV heparin>>apixaban   Interstitial lung disease -There is evidence on the patient's CTA chest he has underlying existing ILD as there is lower lobe honeycombing noted -This certainly will increase the patient's morbidity given his acute medical illnesses   Major neurocognitive disorder -Restart Aricept once able to take p.o. -Patient was on Depakote 250 mg twice daily at [facility]   Acute metabolic encephalopathy -Multifactorial including respiratory failure, infectious process -CT brain negative -UA negative for pyuria -overall improved, now likely at baseline   CKD stage II -Baseline creatinine 0.8-1.1 -Monitor with diuresis   Morbid obesity -BMI 40.35   Hyperglycemia -Check hemoglobin A1c--pending -Anticipate elevated CBGs secondary to steroids -NovoLog sliding scale   GOC -DNR confirmed with the patient's daughter -Daughter stated that she would consider transition to comfort care if there is no improvement in his clinical condition     Family Communication: daughter updated   Consultants:  none   Code Status: DNR   DVT Prophylaxis:  apixaban     Procedures: As Listed in Progress Note Above   Antibiotics: Vanc 12/15>>12/17 Cefepime 12/15>>12/17 Azithro 12/15             Subjective: "You're a good man"  otherwise ROS not possible.  Alert and awake.  Otherwise speaks incomprehensibly  Objective: Vitals:   06/26/22 1000 06/26/22 1100 06/26/22 1137 06/26/22 1200  BP: 105/62 (!) 119/92  (!) 129/57  Pulse: (!) 55 70 76 70  Resp: (!) 25 (!) 32 (!) 30 17  Temp:   98 F (36.7 C)   TempSrc:   Bladder   SpO2: 97% 93% (!) 85% 95%  Weight:      Height:        Intake/Output Summary (Last 24 hours) at 06/26/2022 1233 Last data filed at 06/26/2022 0930 Gross per 24 hour  Intake 750.35 ml  Output 850 ml  Net -99.65 ml   Weight change: 0.7  kg Exam:  General:  Pt is alert, follows commands appropriately, not in acute distress HEENT: No icterus, No thrush, No neck mass, [place]/AT Cardiovascular: RRR, S1/S2, no rubs, no gallops Respiratory: bibasilar rales. No wheeze Abdomen: Soft/+BS, non tender, non distended, no guarding Extremities: 1+RLE edema, No lymphangitis, No petechiae, No rashes, no synovitis   Data Reviewed: I have personally reviewed following labs and imaging studies Basic Metabolic Panel: Recent Labs  Lab 06/23/22 1240 06/24/22 0624 06/25/22 0000 06/26/22 0312  NA 142 144 144 145  K 4.8 4.3 3.7 3.7  CL 100 99 99 99  CO2 33* 37* 33* 38*  GLUCOSE 105* 161* 123* 126*  BUN 13 16 24* 31*  CREATININE 0.89 0.83 0.98 1.05  CALCIUM 8.4* 8.4* 8.5* 8.8*  MG  --  2.1 2.0 2.4  PHOS  --  6.0* 2.7 2.7   Liver Function Tests: Recent Labs  Lab 06/23/22 1240 06/24/22 0624 06/25/22 0000 06/26/22 0312  AST 32 _0 ALT _1 ALKPHOS 73 76 65 62  BILITOT 0.9 0.6 1.0 0.7  PROT 7.7 7.6 7.2 6.9  ALBUMIN 2.7* 2.7* 2.7* 2.6*   No results for input(s): "LIPASE", "AMYLASE" in the last 168 hours. No  results for input(s): "AMMONIA" in the last 168 hours. Coagulation Profile: Recent Labs  Lab 06/23/22 1240  INR 1.3*   CBC: Recent Labs  Lab 06/23/22 1240 06/24/22 0624 06/25/22 0000 06/26/22 0312  WBC 8.0 7.8 9.4 10.4  NEUTROABS 5.4 6.8 7.7 8.8*  HGB 11.6* 11.5* 10.7* 10.3*  HCT 39.3 39.7 35.5* 34.1*  MCV 107.1* 108.8* 105.0* 104.9*  PLT 332 318 306 274   Cardiac Enzymes: No results for input(s): "CKTOTAL", "CKMB", "CKMBINDEX", "TROPONINI" in the last 168 hours. BNP: Invalid input(s): "POCBNP" CBG: Recent Labs  Lab 06/25/22 2046 06/26/22 0035 06/26/22 0503 06/26/22 0745 06/26/22 1121  GLUCAP 121* 122* 147* 130* 115*   HbA1C: No results for input(s): "HGBA1C" in the last 72 hours. Urine analysis:    Component Value Date/Time   COLORURINE COLORLESS (A) 06/23/2022 1400    APPEARANCEUR CLEAR 06/23/2022 1400   LABSPEC 1.005 06/23/2022 1400   PHURINE 5.0 06/23/2022 1400   GLUCOSEU NEGATIVE 06/23/2022 1400   HGBUR SMALL (A) 06/23/2022 1400   BILIRUBINUR NEGATIVE 06/23/2022 1400   KETONESUR NEGATIVE 06/23/2022 1400   PROTEINUR NEGATIVE 06/23/2022 1400   UROBILINOGEN 0.2 12/22/2009 2127   NITRITE NEGATIVE 06/23/2022 1400   LEUKOCYTESUR NEGATIVE 06/23/2022 1400   Sepsis Labs: _0 (procalcitonin:4,lacticidven:4) ) Recent Results (from the past 240 hour(s))  Culture, blood (Routine x 2)     Status: None (Preliminary result)   Collection Time: 06/23/22 12:40 PM   Specimen: BLOOD  Result Value Ref Range Status   Specimen Description BLOOD  Final   Special Requests   Final    RIGHT ANTECUBITAL BOTTLES DRAWN AEROBIC AND ANAEROBIC Blood Culture adequate volume   Culture   Final    NO GROWTH 3 DAYS Performed at [facility], [address], [place], [place] [address]    Report Status PENDING  Incomplete  Culture, blood (Routine x 2)     Status: None (Preliminary result)   Collection Time: 06/23/22  2:47 PM   Specimen: BLOOD  Result Value Ref Range Status   Specimen Description BLOOD BLOOD LEFT HAND  Final   Special Requests   Final    BOTTLES DRAWN AEROBIC AND ANAEROBIC Blood Culture adequate volume BLOOD LEFT HAND   Culture   Final    NO GROWTH 3 DAYS Performed at [facility], [address], [place],  [address]    Report Status PENDING  Incomplete  Resp panel by RT-PCR (RSV, Flu A&B, Covid) Anterior Nasal Swab     Status: Abnormal   Collection Time: 06/23/22  3:19 PM   Specimen: Anterior Nasal Swab  Result Value Ref Range Status   SARS Coronavirus 2 by RT PCR POSITIVE (A) NEGATIVE Final    Comment: (NOTE) SARS-CoV-2 target nucleic acids are DETECTED.  The SARS-CoV-2 RNA is generally detectable in upper respiratory specimens during the acute phase of infection. Positive results are indicative of the presence of the identified virus, but  do not rule out bacterial infection or co-infection with other pathogens not detected by the test. Clinical correlation with patient history and other diagnostic information is necessary to determine patient infection status. The expected result is Negative.  Fact Sheet for Patients: EntrepreneurPulse.com.au  Fact Sheet for Healthcare Providers: IncredibleEmployment.be  This test is not yet approved or cleared by the [place] FDA and  has been authorized for detection and/or diagnosis of SARS-CoV-2 by FDA under an Emergency Use Authorization (EUA).  This EUA will remain in effect (meaning this test can be used) for the duration  of  the COVID-19 declaration under Section 564(b)(1) of the A ct, 21 U.S.C. section 360bbb-3(b)(1), unless the authorization is terminated or revoked sooner.     Influenza A by PCR NEGATIVE NEGATIVE Final   Influenza B by PCR NEGATIVE NEGATIVE Final    Comment: (NOTE) The Xpert Xpress SARS-CoV-2/FLU/RSV plus assay is intended as an aid in the diagnosis of influenza from Nasopharyngeal swab specimens and should not be used as a sole basis for treatment. Nasal washings and aspirates are unacceptable for Xpert Xpress SARS-CoV-2/FLU/RSV testing.  Fact Sheet for Patients: EntrepreneurPulse.com.au  Fact Sheet for Healthcare Providers: IncredibleEmployment.be  This test is not yet approved or cleared by the [place] FDA and has been authorized for detection and/or diagnosis of SARS-CoV-2 by FDA under an Emergency Use Authorization (EUA). This EUA will remain in effect (meaning this test can be used) for the duration of the COVID-19 declaration under Section 564(b)(1) of the Act, 21 U.S.C. section 360bbb-3(b)(1), unless the authorization is terminated or revoked.     Resp Syncytial Virus by PCR NEGATIVE NEGATIVE Final    Comment: (NOTE) Fact Sheet for  Patients: EntrepreneurPulse.com.au  Fact Sheet for Healthcare Providers: IncredibleEmployment.be  This test is not yet approved or cleared by the [place] FDA and has been authorized for detection and/or diagnosis of SARS-CoV-2 by FDA under an Emergency Use Authorization (EUA). This EUA will remain in effect (meaning this test can be used) for the duration of the COVID-19 declaration under Section 564(b)(1) of the Act, 21 U.S.C. section 360bbb-3(b)(1), unless the authorization is terminated or revoked.  Performed at [facility], [address], [place], [place] [address]   MRSA Next Gen by PCR, Nasal     Status: None   Collection Time: 06/24/22  5:39 PM   Specimen: Nasal Mucosa; Nasal Swab  Result Value Ref Range Status   MRSA by PCR Next Gen NOT DETECTED NOT DETECTED Final    Comment: (NOTE) The GeneXpert MRSA Assay (FDA approved for NASAL specimens only), is one component of a comprehensive MRSA colonization surveillance program. It is not intended to diagnose MRSA infection nor to guide or monitor treatment for MRSA infections. Test performance is not FDA approved in patients less than 59 years old. Performed at [facility], [address], [place], [place] [address]      Scheduled Meds:  apixaban  10 mg Oral BID   Followed by   Derrill Memo ON 07/02/2022] apixaban  5 mg Oral BID   Chlorhexidine Gluconate Cloth  6 each Topical Daily   donepezil  10 mg Oral QHS   insulin aspart  0-9 Units Subcutaneous Q4H   ipratropium-albuterol  3 mL Nebulization Q6H   methylPREDNISolone (SOLU-MEDROL) injection  80 mg Intravenous Q12H   pantoprazole (PROTONIX) IV  40 mg Intravenous QHS   Continuous Infusions:  remdesivir 100 mg in sodium chloride 0.9 % 100 mL IVPB Stopped (06/26/22 0835)    Procedures/Studies: ECHOCARDIOGRAM COMPLETE  Result Date: 06/24/2022    ECHOCARDIOGRAM REPORT   Patient Name:   EARLEY GROBE Date of Exam: 06/24/2022 Medical  Rec #:  [id number]      Height:       66.0 in Accession #:    [id number]     Weight:       250.0 lb Date of Birth:  09-May-1941      BSA:          2.199 m Patient Age:    34 years  BP:           102/54 mmHg Patient Gender: M              HR:           71 bpm. Exam Location:  [facility] Procedure: 2D Echo, Color Doppler and Cardiac Doppler Indications:    I26.02 Pulmonary embolus  History:        Patient has no prior history of Echocardiogram examinations.  Sonographer:    Raquel Sarna Senior RDCS Referring Phys: [id number] OLADAPO ADEFESO  Sonographer Comments: Technically difficult study due to lung interference; COVID+ on bipap IMPRESSIONS  1. Left ventricular ejection fraction, by estimation, is 50 to 55%. The left ventricle has low normal function. The left ventricle has no regional wall motion abnormalities. There is mild left ventricular hypertrophy. Left ventricular diastolic parameters are indeterminate.  2. Right ventricular systolic function is low normal. The right ventricular size is normal. There is mildly elevated pulmonary artery systolic pressure. The estimated right ventricular systolic pressure is 47.4 mmHg.  3. Left atrial size was moderately dilated.  4. The mitral valve is normal in structure. No evidence of mitral valve regurgitation.  5. The aortic valve is tricuspid. There is mild calcification of the aortic valve. There is mild thickening of the aortic valve. Aortic valve regurgitation is not visualized. Aortic valve sclerosis/calcification is present, without any evidence of aortic stenosis.  6. The inferior vena cava is dilated in size with <50% respiratory variability, suggesting right atrial pressure of 15 mmHg. Comparison(s): No prior Echocardiogram. FINDINGS  Left Ventricle: Left ventricular ejection fraction, by estimation, is 50 to 55%. The left ventricle has low normal function. The left ventricle has no regional wall motion abnormalities. The left ventricular internal cavity size was  normal in size. There is mild left ventricular hypertrophy. Left ventricular diastolic parameters are indeterminate. Right Ventricle: The right ventricular size is normal. No increase in right ventricular wall thickness. Right ventricular systolic function is low normal. There is mildly elevated pulmonary artery systolic pressure. The tricuspid regurgitant velocity is 2.39 m/s, and with an assumed right atrial pressure of 15 mmHg, the estimated right ventricular systolic pressure is 25.9 mmHg. Left Atrium: Left atrial size was moderately dilated. Right Atrium: Right atrial size was normal in size. Pericardium: There is no evidence of pericardial effusion. Presence of epicardial fat layer. Mitral Valve: The mitral valve is normal in structure. No evidence of mitral valve regurgitation. Tricuspid Valve: The tricuspid valve is normal in structure. Tricuspid valve regurgitation is trivial. No evidence of tricuspid stenosis. Aortic Valve: The aortic valve is tricuspid. There is mild calcification of the aortic valve. There is mild thickening of the aortic valve. There is mild aortic valve annular calcification. Aortic valve regurgitation is not visualized. Aortic valve sclerosis/calcification is present, without any evidence of aortic stenosis. Pulmonic Valve: The pulmonic valve was not well visualized. Pulmonic valve regurgitation is not visualized. No evidence of pulmonic stenosis. Aorta: The aortic root and ascending aorta are structurally normal, with no evidence of dilitation. Venous: The inferior vena cava is dilated in size with less than 50% respiratory variability, suggesting right atrial pressure of 15 mmHg. IAS/Shunts: No atrial level shunt detected by color flow Doppler.  LEFT VENTRICLE PLAX 2D LVIDd:         4.00 cm LVIDs:         3.10 cm LV PW:         1.20 cm LV IVS:  1.20 cm LVOT diam:     2.10 cm LV SV:         48 LV SV Index:   22 LVOT Area:     3.46 cm  RIGHT VENTRICLE RV S prime:     5.98 cm/s  TAPSE (M-mode): 1.3 cm LEFT ATRIUM              Index        RIGHT ATRIUM           Index LA diam:        3.80 cm  1.73 cm/m   RA Area:     18.30 cm LA Vol (A2C):   84.8 ml  38.57 ml/m  RA Volume:   43.60 ml  19.83 ml/m LA Vol (A4C):   108.0 ml 49.12 ml/m LA Biplane Vol: 101.0 ml 45.94 ml/m  AORTIC VALVE LVOT Vmax:   66.60 cm/s LVOT Vmean:  44.500 cm/s LVOT VTI:    0.138 m  AORTA Ao Root diam: 3.20 cm Ao Asc diam:  3.40 cm TRICUSPID VALVE TR Peak grad:   22.8 mmHg TR Vmax:        239.00 cm/s  SHUNTS Systemic VTI:  0.14 m Systemic Diam: 2.10 cm Rudean Haskell MD Electronically signed by Rudean Haskell MD Signature Date/Time: 06/24/2022/12:19:36 PM    Final    US Venous Img Lower Bilateral (DVT)  Result Date: 06/24/2022 CLINICAL DATA:  81 year old male with history of pulmonary embolism. Shortness of breath. Evaluate for deep venous thrombosis. EXAM: BILATERAL LOWER EXTREMITY VENOUS DOPPLER ULTRASOUND TECHNIQUE: Gray-scale sonography with graded compression, as well as color Doppler and duplex ultrasound were performed to evaluate the lower extremity deep venous systems from the level of the common femoral vein and including the common femoral, femoral, profunda femoral, popliteal and calf veins including the posterior tibial, peroneal and gastrocnemius veins when visible. The superficial great saphenous vein was also interrogated. Spectral Doppler was utilized to evaluate flow at rest and with distal augmentation maneuvers in the common femoral, femoral and popliteal veins. COMPARISON:  No priors. FINDINGS: RIGHT LOWER EXTREMITY Innumerable occlusive and nonocclusive thrombi noted throughout the right lower extremity venous system, as evidenced by areas of echogenic material within the vascular lumen, incomplete compression and lack of augmentation. LEFT LOWER EXTREMITY Innumerable occlusive and nonocclusive thrombi throughout the left lower extremity venous system, as evidenced by areas of  echogenic material within the vascular lumen, incomplete compression and lack of augmentation. IMPRESSION: Study is positive for extensive occlusive and nonocclusive thrombus throughout the lower extremity venous system bilaterally. These results will be called to the ordering clinician or representative by the Radiologist Assistant, and communication documented in the PACS or [employer]. Electronically Signed   By: Vinnie Langton M.D.   On: 06/24/2022 10:34   CT Head Wo Contrast  Result Date: 06/23/2022 CLINICAL DATA:  AMS EXAM: CT HEAD WITHOUT CONTRAST TECHNIQUE: Contiguous axial images were obtained from the base of the skull through the vertex without intravenous contrast. RADIATION DOSE REDUCTION: This exam was performed according to the departmental dose-optimization program which includes automated exposure control, adjustment of the mA and/or kV according to patient size and/or use of iterative reconstruction technique. COMPARISON:  CT head 11/30/2014 FINDINGS: Brain: Cerebral ventricle sizes are concordant with the degree of cerebral volume loss. Patchy and confluent areas of decreased attenuation are noted throughout the deep and periventricular white matter of the cerebral hemispheres bilaterally, compatible with chronic microvascular ischemic disease. no evidence of large-territorial acute  infarction. No parenchymal hemorrhage. No mass lesion. No extra-axial collection. No mass effect or midline shift. No hydrocephalus. Basilar cisterns are patent. Vascular: No hyperdense vessel. Skull: No acute fracture or focal lesion. Sinuses/Orbits: Paranasal sinuses and mastoid air cells are clear. Bilateral lens replacement. Otherwise the orbits are unremarkable. Other: None. IMPRESSION: No acute intracranial abnormality. Electronically Signed   By: Iven Finn M.D.   On: 06/23/2022 17:13   CT Angio Chest PE W and/or Wo Contrast  Result Date: 06/23/2022 CLINICAL DATA:  cf PE vs PNA EXAM: CT  ANGIOGRAPHY CHEST WITH CONTRAST TECHNIQUE: Multidetector CT imaging of the chest was performed using the standard protocol during bolus administration of intravenous contrast. Multiplanar CT image reconstructions and MIPs were obtained to evaluate the vascular anatomy. RADIATION DOSE REDUCTION: This exam was performed according to the departmental dose-optimization program which includes automated exposure control, adjustment of the mA and/or kV according to patient size and/or use of iterative reconstruction technique. CONTRAST:  120m OMNIPAQUE IOHEXOL 350 MG/ML SOLN COMPARISON:  None Available. FINDINGS: Cardiovascular: Satisfactory opacification of the pulmonary arteries to the segmental level. Nonocclusive filling defect of the most distal right pulmonary artery with extension into the proximal segmental right lower and right middle lobe pulmonary arteries. Enlarged right atria. Enlarged left atrium. Associated enlarged right to left ventricular ratio of 1. The main pulmonary artery is normal in caliber. No significant pericardial effusion. The thoracic aorta is normal in caliber. Moderate to severe atherosclerotic plaque of the thoracic aorta. Four-vessel coronary artery calcifications. Mediastinum/Nodes: No enlarged mediastinal, hilar, or axillary lymph nodes. Thyroid gland, trachea, and esophagus demonstrate no significant findings. Lungs/Pleura: Respiratory motion artifact with limited evaluation. Mild paraseptal emphysematous changes. Mild interlobular septal wall thickening. Bilateral basilar lower lobe, right greater than left, honeycombing. No focal consolidation. No pulmonary nodule. No pulmonary mass. Trace bilateral pleural effusions. No pneumothorax. Upper Abdomen: No acute abnormality. Musculoskeletal: No chest wall abnormality. No suspicious lytic or blastic osseous lesions. No acute displaced fracture. Multilevel moderate degenerative changes of the spine. Review of the MIP images confirms the  above findings. IMPRESSION: 1. Nonocclusive distal right main and proximal segmental right lower and middle lobe pulmonary emboli. Associated enlarged right to left ventricular ratio of 1.2. No pulmonary infarction. 2. Cardiomegaly with mild pulmonary edema and bilateral trace pleural effusions. 3. 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 Consider nonemergent outpatient high-resolution chest CT for further evaluation. 4. Aortic Atherosclerosis (ICD10-I70.0) including four-vessel coronary calcifications. 5.  Emphysema (ICD10-J43.9). These results were called by telephone at the time of interpretation on 06/23/2022 at 5:07 pm to provider RMargaretmary Eddy, who verbally acknowledged these results. Electronically Signed   By: MIven FinnM.D.   On: 06/23/2022 17:11   DG Chest Port 1 View  Result Date: 06/23/2022 CLINICAL DATA:  Shortness of breath. Diagnosed with pneumonia 3 weeks ago. Congestion. EXAM: PORTABLE CHEST 1 VIEW COMPARISON:  AP chest 07/24/2015; CT chest 12/28/2009 FINDINGS: There are moderately to markedly decreased lung volumes, limiting evaluation. The cardiac silhouette may be mildly enlarged. Mediastinal contours are grossly within normal limits for low lung volumes. Moderate calcifications within the aortic arch. Moderate bilateral interstitial thickening appears unchanged from 07/24/2015 prior radiographs, especially given the lower lung volumes on the current exam. There is bibasilar bronchovascular crowding. Evaluation of the lung bases is limited. No large pleural effusion is seen. No pneumothorax. 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 IMPRESSION: 1. Moderately to markedly decreased lung volumes, limiting evaluation. 2. Moderate bilateral  interstitial thickening appears not significantly changed from 07/24/2015, especially given the lower lung volumes on the current exam. This is favored represent  chronic interstitial lung disease. Electronically Signed   By: Yvonne Kendall M.D.   On: 06/23/2022 14:31    Orson Eva, DO  [facility]  If 7PM-7AM, please contact night-coverage [email or web address] Password [email or web address] 06/26/2022, 12:33 PM   LOS: 3 days

## 2022-06-26 NOTE — Evaluation (Signed)
Clinical/Bedside Swallow Evaluation Patient Details  Name: ADARRIUS GRAEFF MRN: [id number] Date of Birth: 15-Jun-1941  Today's Date: 06/26/2022 Time: SLP Start Time (ACUTE ONLY): 1420 SLP Stop Time (ACUTE ONLY): 1451 SLP Time Calculation (min) (ACUTE ONLY): 31 min  Past Medical History:  Past Medical History:  Diagnosis Date   Cancer ([place])    Dementia ([place])    Past Surgical History:  Past Surgical History:  Procedure Laterality Date   INTRAMEDULLARY (IM) NAIL INTERTROCHANTERIC Left 07/24/2015   Procedure: INTRAMEDULLARY (IM) NAIL INTERTROCHANTRIC AFFIXUS NAIL;  Surgeon: Wylene Simmer, MD;  Location: [place];  Service: Orthopedics;  Laterality: Left;   HPI:  81 year old male with a history of dementia, CKD stage II, obesity, prostate cancer, presenting from [facility] with shortness of breath, cough, and chest congestion. Pt currently has encephalopathy and is being treated for PNA. Coughing episodes reported with liquids; BSE requested.    Assessment / Plan / Recommendation  Clinical Impression  Clinical swallowing evaluation completed whiule Pt was sitting upright in bed; unfortunately we are not aware of Pt's baseline diet at [facility]. Pt demonstrated overt s/sx of oropharyngeal dsypahgia including immediate and delayed coughing and wet vocal quality with thin liquids. Oral mechanism exam reveals edentulous status. With trials of NTL Pt demonstrated one episode of delayed coughing however, it did not seem directly related to swallowing. Pt consumed puree without incident and with regular, Pt required additional and prolonged time to masticate. Recommend downgrade Pt's diet to D2/fine chop and NECTAR thick liquids. ST will continue to follow ongoing diagnostic therapy and will consider MBSS to objectively assess the swallowing function if indicated. Thank you SLP Visit Diagnosis: Dysphagia, unspecified (R13.10)    Aspiration Risk  Mild aspiration risk    Diet Recommendation Dysphagia  2 (Fine chop);Nectar-thick liquid   Liquid Administration via: Cup Medication Administration: Crushed with puree Supervision: Patient able to self feed Compensations: Minimize environmental distractions;Slow rate;Small sips/bites Postural Changes: Seated upright at 90 degrees    Other  Recommendations Oral Care Recommendations: Oral care BID    Recommendations for follow up therapy are one component of a multi-disciplinary discharge planning process, led by the attending physician.  Recommendations may be updated based on patient status, additional functional criteria and insurance authorization.           Frequency and Duration min 2x/week  1 week       Prognosis Prognosis for Safe Diet Advancement: Fair Barriers to Reach Goals: Cognitive deficits      Swallow Study   General Date of Onset: 06/23/22 HPI: 81 year old male with a history of dementia, CKD stage II, obesity, prostate cancer, presenting from [facility] with shortness of breath, cough, and chest congestion. Pt currently has encephalopathy and is being treated for PNA. Coughing episodes reported with liquids; BSE requested. Type of Study: Bedside Swallow Evaluation Previous Swallow Assessment: none in chartq Diet Prior to this Study: Regular;Thin liquids Temperature Spikes Noted: No Respiratory Status: Nasal cannula History of Recent Intubation: No Behavior/Cognition: Alert;Cooperative Oral Cavity Assessment: Within Functional Limits Oral Care Completed by SLP: Yes Oral Cavity - Dentition: Edentulous Vision: Functional for self-feeding Self-Feeding Abilities: Able to feed self;Needs assist;Needs set up Patient Positioning: Upright in bed Baseline Vocal Quality: Normal Volitional Cough: Cognitively unable to elicit Volitional Swallow: Unable to elicit    Oral/Motor/Sensory Function Overall Oral Motor/Sensory Function: Within functional limits   Ice Chips Ice chips: Within functional limits   Thin Liquid Thin  Liquid: Impaired Presentation: Cup Pharyngeal  Phase Impairments:  Wet Vocal Quality;Cough - Delayed;Cough - Immediate    Nectar Thick Nectar Thick Liquid: Impaired Pharyngeal Phase Impairments: Cough - Delayed   Honey Thick Honey Thick Liquid: Not tested   Puree Puree: Within functional limits   Solid     Solid: Impaired Oral Phase Functional Implications: Impaired mastication;Prolonged oral transit     Janett Kamath H. Roddie Mc, CCC-SLP Speech Language Pathologist  Wende Bushy 06/26/2022,2:51 PM

## 2022-06-27 DIAGNOSIS — I2699 Other pulmonary embolism without acute cor pulmonale: Secondary | ICD-10-CM | POA: Diagnosis not present

## 2022-06-27 DIAGNOSIS — U071 COVID-19: Secondary | ICD-10-CM | POA: Diagnosis not present

## 2022-06-27 DIAGNOSIS — J9601 Acute respiratory failure with hypoxia: Secondary | ICD-10-CM | POA: Diagnosis not present

## 2022-06-27 DIAGNOSIS — J1282 Pneumonia due to coronavirus disease 2019: Secondary | ICD-10-CM | POA: Diagnosis not present

## 2022-06-27 LAB — C-REACTIVE PROTEIN: CRP: 2.1 mg/dL — ABNORMAL HIGH (ref <1.0)

## 2022-06-27 LAB — CBC WITH DIFFERENTIAL/PLATELET
Abs Immature Granulocytes: 0.12 10*3/uL — ABNORMAL HIGH (ref 0.00–0.07)
Basophils Absolute: 0 10*3/uL (ref 0.0–0.1)
Basophils Relative: 0 %
Eosinophils Absolute: 0 10*3/uL (ref 0.0–0.5)
Eosinophils Relative: 0 %
HCT: 33.2 % — ABNORMAL LOW (ref 39.0–52.0)
Hemoglobin: 10.1 g/dL — ABNORMAL LOW (ref 13.0–17.0)
Immature Granulocytes: 2 %
Lymphocytes Relative: 12 %
Lymphs Abs: 1 10*3/uL (ref 0.7–4.0)
MCH: 31.9 pg (ref 26.0–34.0)
MCHC: 30.4 g/dL (ref 30.0–36.0)
MCV: 104.7 fL — ABNORMAL HIGH (ref 80.0–100.0)
Monocytes Absolute: 0.5 10*3/uL (ref 0.1–1.0)
Monocytes Relative: 6 %
Neutro Abs: 6.4 10*3/uL (ref 1.7–7.7)
Neutrophils Relative %: 80 %
Platelets: 252 10*3/uL (ref 150–400)
RBC: 3.17 MIL/uL — ABNORMAL LOW (ref 4.22–5.81)
RDW: 18.8 % — ABNORMAL HIGH (ref 11.5–15.5)
WBC: 8 10*3/uL (ref 4.0–10.5)
nRBC: 0.4 % — ABNORMAL HIGH (ref 0.0–0.2)

## 2022-06-27 LAB — COMPREHENSIVE METABOLIC PANEL
ALT: 23 U/L (ref 0–44)
AST: 29 U/L (ref 15–41)
Albumin: 2.4 g/dL — ABNORMAL LOW (ref 3.5–5.0)
Alkaline Phosphatase: 57 U/L (ref 38–126)
Anion gap: 7 (ref 5–15)
BUN: 36 mg/dL — ABNORMAL HIGH (ref 8–23)
CO2: 38 mmol/L — ABNORMAL HIGH (ref 22–32)
Calcium: 8.8 mg/dL — ABNORMAL LOW (ref 8.9–10.3)
Chloride: 100 mmol/L (ref 98–111)
Creatinine, Ser: 1 mg/dL (ref 0.61–1.24)
GFR, Estimated: >60 mL/min (ref >60)
Glucose, Bld: 153 mg/dL — ABNORMAL HIGH (ref 70–99)
Potassium: 3.7 mmol/L (ref 3.5–5.1)
Sodium: 145 mmol/L (ref 135–145)
Total Bilirubin: 0.7 mg/dL (ref 0.3–1.2)
Total Protein: 6.5 g/dL (ref 6.5–8.1)

## 2022-06-27 LAB — MAGNESIUM: Magnesium: 2.3 mg/dL (ref 1.7–2.4)

## 2022-06-27 LAB — D-DIMER, QUANTITATIVE: D-Dimer, Quant: 2.59 ug/mL-FEU — ABNORMAL HIGH (ref 0.00–0.50)

## 2022-06-27 LAB — GLUCOSE, CAPILLARY
Glucose-Capillary: 119 mg/dL — ABNORMAL HIGH (ref 70–99)
Glucose-Capillary: 130 mg/dL — ABNORMAL HIGH (ref 70–99)
Glucose-Capillary: 131 mg/dL — ABNORMAL HIGH (ref 70–99)
Glucose-Capillary: 133 mg/dL — ABNORMAL HIGH (ref 70–99)

## 2022-06-27 LAB — CULTURE, BLOOD (ROUTINE X 2): Culture: NO GROWTH

## 2022-06-27 LAB — PHOSPHORUS: Phosphorus: 3 mg/dL (ref 2.5–4.6)

## 2022-06-27 LAB — FERRITIN: Ferritin: 390 ng/mL — ABNORMAL HIGH (ref 24–336)

## 2022-06-27 MED ORDER — PREDNISONE 50 MG PO TABS: 50.0000 mg | ORAL_TABLET | Freq: Two times a day (BID) | ORAL | 0 refills | Status: AC

## 2022-06-27 MED ORDER — PREDNISONE 20 MG PO TABS: 50.0000 mg | ORAL_TABLET | Freq: Two times a day (BID) | ORAL | Status: DC

## 2022-06-27 MED ORDER — APIXABAN 5 MG PO TABS: 10.0000 mg | ORAL_TABLET | Freq: Two times a day (BID) | ORAL | 0 refills | Status: AC

## 2022-06-27 MED ORDER — PREDNISONE 50 MG PO TABS: 50.0000 mg | ORAL_TABLET | Freq: Two times a day (BID) | ORAL | 0 refills | Status: DC

## 2022-06-27 NOTE — Discharge Summary (Addendum)
Physician Discharge Summary   Patient: Bradley Roman MRN: [id number] DOB: 08-03-1940  Admit date:     06/23/2022  Discharge date: 06/27/22  Discharge Physician: Shanon Brow Daleen Steinhaus   PCP: Jani Gravel, MD   Recommendations at discharge:   Please follow up with primary care provider within 1-2 weeks  Please repeat BMP and CBC in one week  Please keep patient on 2L [place] and wean off for saturation >92%     Hospital Course: 81 year old male with a history of dementia, CKD stage II, obesity, prostate cancer, presenting from [facility] with shortness of breath, cough, and chest congestion.  Due to the patient's encephalopathy, the patient is unable to provide history.  History is obtained from review the medical record and speaking with the patient's daughter.  Unfortunately, the patient's daughter is unable to provide any significant history regarding the patient's current illness other than to say that the nursing facility had told her they have been treating the patient for pneumonia.  Also, she was also notified sometime after Thanksgiving that he had a blood clot in his leg, and the patient was being started on " blood thinners".  She is not aware that he had any scans of his chest.  Nevertheless, EMS was activated secondary to the above symptoms and altered mental status and confusion.  The patient was noted to have oxygen saturation of 76% on room air.  He was placed on nonrebreather with improvement and brought to the emergency department.  Review of his medical record from [facility] shows that the patient was recently started on apixaban for DVT.  It appears that the start date was 06/15/2022.  Interestingly, the patient had been treated for a diagnosis of pneumonia in the past 2 weeks with Augmentin and azithromycin. In the ED, the patient was afebrile and hemodynamically stable albeit with soft blood pressures initially.  He was somewhat agitated was given Ativan x 2 doses and placed on BiPAP.  WBC  8.0, hemoglobin 9.6, platelets 232,000.  Sodium 142, potassium 4.8, bicarbonate 33, serum creatinine 0.89.  AST 32, ALT 16, alk phosphatase 73, total bilirubin 0.9.  Lactic acid 1.7>> 1.9.  EKG showed atrial fibrillation with nonspecific T wave changes.  VBG showed 7.3 5/87/36/48.  CTA chest was positive for PE in the right middle lobe and right lower lobe.  There is mild interlobular septal thickening.  There is no consolidation.  There is trace bilateral pleural effusions.  RV: LV ratio 1.2.  CT of the brain was negative.  The patient was started on IV heparin.  He was given a dose of IV furosemide 60 mg IV.  He was also given vancomycin, cefepime, azithromycin.  He was admitted for further treatment and evaluation of his respiratory failure.  Assessment and Plan:  Acute respiratory failure with hypoxia and hypercarbia -Secondary to COVID-19 pneumonia in the setting of underlying ILD pulmonary embolus, and pulmonary edema -Wean off BiPAP as tolerated -Repeat ABG 7.51/63/60/50 (0.5) -12/17--now on heated high flow @ 30L, 100% FiO2>>tolerating better than BiPAP -12/18--20L, 80% with saturation 99-100% -weaned to 2L [place]>>continue to wean off after d/c -prednisone 50 mg bid x 5 more days after d/c   COVID-19 pneumonia -CRP 6.4>>5.3>>4.1 -Ferritin 180>>354>>396 -D-dimer 2.76>>2.43>>2.27 -Continue IV Solu-Medrol -Continue remdesivir--received 3 days -Vitamin C and zinc once are alert enough to take p.o. -PCT <0.10   Acute pulmonary embolus/leg DVT -06/23/2022 CTA chest--+PE RML, RLL -After review of the records, suspect what may have been felt to be pneumonia  initially was likely PE that may have been present -Therefore, do not feel that this is failure of the patient's apixaban -Rather that his respiratory failure decompensation is attributable to COVID-19 and fluid overload on top of his suspected existing PE -12/16 Echo--EF 50-55%, no WMA, low normal RVF, PASP 37.9 -12/16 [place] legs>>bilateral  DVT -initially on IV heparin -transitioned to po apixaban 10 mg bid, then 5 mg bid on 09/27/20   Acute diastolic CHF -48/25 Echo--EF 50-55%, no WMA, low normal RVF, PASP 37.9 -There are signs of volume overload -Appears that the patient was given intramuscular dose of furosemide at [facility] -given lasix IV x 2 this hospitalization -appears more euvolemic now -obtain ReDS = 40 before lasix on 12/17 -now clinically euvolemic   Atrial fibrillation, type unspecified -Personally reviewed EKG--atrial fibrillation, nonspecific T wave change -Reviewed telemetry--rate controlled A-fib -Already on anticoagulation with IV heparin>>apixaban   Interstitial lung disease -There is evidence on the patient's CTA chest he has underlying existing ILD as there is lower lobe honeycombing noted -This certainly will increase the patient's morbidity given his acute medical illnesses   Major neurocognitive disorder -Restart Aricept once able to take p.o. -Patient was on Depakote 250 mg twice daily at [facility]   Acute metabolic encephalopathy -Multifactorial including respiratory failure, infectious process -CT brain negative -UA negative for pyuria -overall improved, now likely at baseline--does not follow commands -evaluated by speech>dys 2 diet with nectar thickened liquids   CKD stage II -Baseline creatinine 0.8-1.1 -Monitor with diuresis   Morbid obesity -BMI 40.35   Hyperglycemia -Check hemoglobin A1c--pending at time of DC -CBGs remained largely controlled on steroids -NovoLog sliding scale   GOC -DNR confirmed with the patient's daughter -Daughter stated that she would consider transition to comfort care if there is no improvement in his clinical condition      Consultants: none Procedures performed: none  Disposition: Skilled nursing facility Diet recommendation:  Dysphagia type 2 Nectar Liquid DISCHARGE MEDICATION: Allergies as of 06/27/2022   No Known Allergies       Medication List     STOP taking these medications    furosemide 10 MG/ML injection Commonly known as: LASIX       TAKE these medications    acetaminophen 325 MG tablet Commonly known as: TYLENOL Take 2 tablets (650 mg total) by mouth every 6 (six) hours as needed for mild pain (or Fever >/= 101).   apixaban 5 MG Tabs tablet Commonly known as: ELIQUIS Take 2 tablets (10 mg total) by mouth 2 (two) times daily. Then 1 tab (5 mg) bid starting 07/02/22 What changed:  how much to take additional instructions   bisacodyl 5 MG EC tablet Commonly known as: DULCOLAX Take 5 mg by mouth daily as needed for moderate constipation.   D3 PO Take 1 tablet by mouth daily.   divalproex 250 MG DR tablet Commonly known as: DEPAKOTE Take 250 mg by mouth 2 (two) times daily.   predniSONE 50 MG tablet Commonly known as: DELTASONE Take 1 tablet (50 mg total) by mouth 2 (two) times daily with a meal. X 5 days        Discharge Exam: Filed Weights   06/25/22 0500 06/26/22 0500 06/27/22 0451  Weight: 105.6 kg 104.3 kg 103.5 kg   HEENT:  [place]/AT, No thrush, no icterus CV:  IRRR, no rub, no S3, no S4 Lung:  bibasilar rales. No wheeze Abd:  soft/+BS, NT Ext:  No edema, no lymphangitis, no synovitis, no rash  Condition at discharge: stable  The results of significant diagnostics from this hospitalization (including imaging, microbiology, ancillary and laboratory) are listed below for reference.   Imaging Studies: ECHOCARDIOGRAM COMPLETE  Result Date: 06/24/2022    ECHOCARDIOGRAM REPORT   Patient Name:   OBI SCRIMA Date of Exam: 06/24/2022 Medical Rec #:  [id number]      Height:       66.0 in Accession #:    [id number]     Weight:       250.0 lb Date of Birth:  1941-03-14      BSA:          2.199 m Patient Age:    78 years       BP:           102/54 mmHg Patient Gender: M              HR:           71 bpm. Exam Location:  [facility] Procedure: 2D Echo, Color Doppler and Cardiac  Doppler Indications:    I26.02 Pulmonary embolus  History:        Patient has no prior history of Echocardiogram examinations.  Sonographer:    Raquel Sarna Senior RDCS Referring Phys: [id number] OLADAPO ADEFESO  Sonographer Comments: Technically difficult study due to lung interference; COVID+ on bipap IMPRESSIONS  1. Left ventricular ejection fraction, by estimation, is 50 to 55%. The left ventricle has low normal function. The left ventricle has no regional wall motion abnormalities. There is mild left ventricular hypertrophy. Left ventricular diastolic parameters are indeterminate.  2. Right ventricular systolic function is low normal. The right ventricular size is normal. There is mildly elevated pulmonary artery systolic pressure. The estimated right ventricular systolic pressure is 73.2 mmHg.  3. Left atrial size was moderately dilated.  4. The mitral valve is normal in structure. No evidence of mitral valve regurgitation.  5. The aortic valve is tricuspid. There is mild calcification of the aortic valve. There is mild thickening of the aortic valve. Aortic valve regurgitation is not visualized. Aortic valve sclerosis/calcification is present, without any evidence of aortic stenosis.  6. The inferior vena cava is dilated in size with <50% respiratory variability, suggesting right atrial pressure of 15 mmHg. Comparison(s): No prior Echocardiogram. FINDINGS  Left Ventricle: Left ventricular ejection fraction, by estimation, is 50 to 55%. The left ventricle has low normal function. The left ventricle has no regional wall motion abnormalities. The left ventricular internal cavity size was normal in size. There is mild left ventricular hypertrophy. Left ventricular diastolic parameters are indeterminate. Right Ventricle: The right ventricular size is normal. No increase in right ventricular wall thickness. Right ventricular systolic function is low normal. There is mildly elevated pulmonary artery systolic pressure. The  tricuspid regurgitant velocity is 2.39 m/s, and with an assumed right atrial pressure of 15 mmHg, the estimated right ventricular systolic pressure is 20.2 mmHg. Left Atrium: Left atrial size was moderately dilated. Right Atrium: Right atrial size was normal in size. Pericardium: There is no evidence of pericardial effusion. Presence of epicardial fat layer. Mitral Valve: The mitral valve is normal in structure. No evidence of mitral valve regurgitation. Tricuspid Valve: The tricuspid valve is normal in structure. Tricuspid valve regurgitation is trivial. No evidence of tricuspid stenosis. Aortic Valve: The aortic valve is tricuspid. There is mild calcification of the aortic valve. There is mild thickening of the aortic valve. There is mild aortic valve annular calcification. Aortic valve regurgitation is not  visualized. Aortic valve sclerosis/calcification is present, without any evidence of aortic stenosis. Pulmonic Valve: The pulmonic valve was not well visualized. Pulmonic valve regurgitation is not visualized. No evidence of pulmonic stenosis. Aorta: The aortic root and ascending aorta are structurally normal, with no evidence of dilitation. Venous: The inferior vena cava is dilated in size with less than 50% respiratory variability, suggesting right atrial pressure of 15 mmHg. IAS/Shunts: No atrial level shunt detected by color flow Doppler.  LEFT VENTRICLE PLAX 2D LVIDd:         4.00 cm LVIDs:         3.10 cm LV PW:         1.20 cm LV IVS:        1.20 cm LVOT diam:     2.10 cm LV SV:         48 LV SV Index:   22 LVOT Area:     3.46 cm  RIGHT VENTRICLE RV S prime:     5.98 cm/s TAPSE (M-mode): 1.3 cm LEFT ATRIUM              Index        RIGHT ATRIUM           Index LA diam:        3.80 cm  1.73 cm/m   RA Area:     18.30 cm LA Vol (A2C):   84.8 ml  38.57 ml/m  RA Volume:   43.60 ml  19.83 ml/m LA Vol (A4C):   108.0 ml 49.12 ml/m LA Biplane Vol: 101.0 ml 45.94 ml/m  AORTIC VALVE LVOT Vmax:   66.60 cm/s  LVOT Vmean:  44.500 cm/s LVOT VTI:    0.138 m  AORTA Ao Root diam: 3.20 cm Ao Asc diam:  3.40 cm TRICUSPID VALVE TR Peak grad:   22.8 mmHg TR Vmax:        239.00 cm/s  SHUNTS Systemic VTI:  0.14 m Systemic Diam: 2.10 cm Rudean Haskell MD Electronically signed by Rudean Haskell MD Signature Date/Time: 06/24/2022/12:19:36 PM    Final    US Venous Img Lower Bilateral (DVT)  Result Date: 06/24/2022 CLINICAL DATA:  81 year old male with history of pulmonary embolism. Shortness of breath. Evaluate for deep venous thrombosis. EXAM: BILATERAL LOWER EXTREMITY VENOUS DOPPLER ULTRASOUND TECHNIQUE: Gray-scale sonography with graded compression, as well as color Doppler and duplex ultrasound were performed to evaluate the lower extremity deep venous systems from the level of the common femoral vein and including the common femoral, femoral, profunda femoral, popliteal and calf veins including the posterior tibial, peroneal and gastrocnemius veins when visible. The superficial great saphenous vein was also interrogated. Spectral Doppler was utilized to evaluate flow at rest and with distal augmentation maneuvers in the common femoral, femoral and popliteal veins. COMPARISON:  No priors. FINDINGS: RIGHT LOWER EXTREMITY Innumerable occlusive and nonocclusive thrombi noted throughout the right lower extremity venous system, as evidenced by areas of echogenic material within the vascular lumen, incomplete compression and lack of augmentation. LEFT LOWER EXTREMITY Innumerable occlusive and nonocclusive thrombi throughout the left lower extremity venous system, as evidenced by areas of echogenic material within the vascular lumen, incomplete compression and lack of augmentation. IMPRESSION: Study is positive for extensive occlusive and nonocclusive thrombus throughout the lower extremity venous system bilaterally. These results will be called to the ordering clinician or representative by the Radiologist Assistant, and  communication documented in the PACS or [employer]. Electronically Signed   By: Mauri Brooklyn.D.  On: 06/24/2022 10:34   CT Head Wo Contrast  Result Date: 06/23/2022 CLINICAL DATA:  AMS EXAM: CT HEAD WITHOUT CONTRAST TECHNIQUE: Contiguous axial images were obtained from the base of the skull through the vertex without intravenous contrast. RADIATION DOSE REDUCTION: This exam was performed according to the departmental dose-optimization program which includes automated exposure control, adjustment of the mA and/or kV according to patient size and/or use of iterative reconstruction technique. COMPARISON:  CT head 11/30/2014 FINDINGS: Brain: Cerebral ventricle sizes are concordant with the degree of cerebral volume loss. Patchy and confluent areas of decreased attenuation are noted throughout the deep and periventricular white matter of the cerebral hemispheres bilaterally, compatible with chronic microvascular ischemic disease. no evidence of large-territorial acute infarction. No parenchymal hemorrhage. No mass lesion. No extra-axial collection. No mass effect or midline shift. No hydrocephalus. Basilar cisterns are patent. Vascular: No hyperdense vessel. Skull: No acute fracture or focal lesion. Sinuses/Orbits: Paranasal sinuses and mastoid air cells are clear. Bilateral lens replacement. Otherwise the orbits are unremarkable. Other: None. IMPRESSION: No acute intracranial abnormality. Electronically Signed   By: Iven Finn M.D.   On: 06/23/2022 17:13   CT Angio Chest PE W and/or Wo Contrast  Result Date: 06/23/2022 CLINICAL DATA:  cf PE vs PNA EXAM: CT ANGIOGRAPHY CHEST WITH CONTRAST TECHNIQUE: Multidetector CT imaging of the chest was performed using the standard protocol during bolus administration of intravenous contrast. Multiplanar CT image reconstructions and MIPs were obtained to evaluate the vascular anatomy. RADIATION DOSE REDUCTION: This exam was performed according to the  departmental dose-optimization program which includes automated exposure control, adjustment of the mA and/or kV according to patient size and/or use of iterative reconstruction technique. CONTRAST:  139m OMNIPAQUE IOHEXOL 350 MG/ML SOLN COMPARISON:  None Available. FINDINGS: Cardiovascular: Satisfactory opacification of the pulmonary arteries to the segmental level. Nonocclusive filling defect of the most distal right pulmonary artery with extension into the proximal segmental right lower and right middle lobe pulmonary arteries. Enlarged right atria. Enlarged left atrium. Associated enlarged right to left ventricular ratio of 1. The main pulmonary artery is normal in caliber. No significant pericardial effusion. The thoracic aorta is normal in caliber. Moderate to severe atherosclerotic plaque of the thoracic aorta. Four-vessel coronary artery calcifications. Mediastinum/Nodes: No enlarged mediastinal, hilar, or axillary lymph nodes. Thyroid gland, trachea, and esophagus demonstrate no significant findings. Lungs/Pleura: Respiratory motion artifact with limited evaluation. Mild paraseptal emphysematous changes. Mild interlobular septal wall thickening. Bilateral basilar lower lobe, right greater than left, honeycombing. No focal consolidation. No pulmonary nodule. No pulmonary mass. Trace bilateral pleural effusions. No pneumothorax. Upper Abdomen: No acute abnormality. Musculoskeletal: No chest wall abnormality. No suspicious lytic or blastic osseous lesions. No acute displaced fracture. Multilevel moderate degenerative changes of the spine. Review of the MIP images confirms the above findings. IMPRESSION: 1. Nonocclusive distal right main and proximal segmental right lower and middle lobe pulmonary emboli. Associated enlarged right to left ventricular ratio of 1.2. No pulmonary infarction. 2. Cardiomegaly with mild pulmonary edema and bilateral trace pleural effusions. 3. 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 Consider nonemergent outpatient high-resolution chest CT for further evaluation. 4. Aortic Atherosclerosis (ICD10-I70.0) including four-vessel coronary calcifications. 5.  Emphysema (ICD10-J43.9). These results were called by telephone at the time of interpretation on 06/23/2022 at 5:07 pm to provider RMargaretmary Eddy, who verbally acknowledged these results. Electronically Signed   By: MIven FinnM.D.   On: 06/23/2022 17:11   [facility]  Result Date: 06/23/2022 CLINICAL DATA:  Shortness of breath. Diagnosed with pneumonia 3 weeks ago. Congestion. EXAM: PORTABLE CHEST 1 VIEW COMPARISON:  AP chest 07/24/2015; CT chest 12/28/2009 FINDINGS: There are moderately to markedly decreased lung volumes, limiting evaluation. The cardiac silhouette may be mildly enlarged. Mediastinal contours are grossly within normal limits for low lung volumes. Moderate calcifications within the aortic arch. Moderate bilateral interstitial thickening appears unchanged from 07/24/2015 prior radiographs, especially given the lower lung volumes on the current exam. There is bibasilar bronchovascular crowding. Evaluation of the lung bases is limited. No large pleural effusion is seen. No pneumothorax. 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 IMPRESSION: 1. Moderately to markedly decreased lung volumes, limiting evaluation. 2. Moderate bilateral interstitial thickening appears not significantly changed from 07/24/2015, especially given the lower lung volumes on the current exam. This is favored represent chronic interstitial lung disease. Electronically Signed   By: Yvonne Kendall M.D.   On: 06/23/2022 14:31    Microbiology: Results for orders placed or performed during the hospital encounter of 06/23/22  Culture, blood (Routine x 2)     Status: None (Preliminary result)   Collection Time: 06/23/22 12:40 PM   Specimen: BLOOD  Result  Value Ref Range Status   Specimen Description BLOOD  Final   Special Requests   Final    RIGHT ANTECUBITAL BOTTLES DRAWN AEROBIC AND ANAEROBIC Blood Culture adequate volume   Culture   Final    NO GROWTH 3 DAYS Performed at [facility], [address], [place], [place] [address]    Report Status PENDING  Incomplete  Culture, blood (Routine x 2)     Status: None (Preliminary result)   Collection Time: 06/23/22  2:47 PM   Specimen: BLOOD  Result Value Ref Range Status   Specimen Description BLOOD BLOOD LEFT HAND  Final   Special Requests   Final    BOTTLES DRAWN AEROBIC AND ANAEROBIC Blood Culture adequate volume BLOOD LEFT HAND   Culture   Final    NO GROWTH 3 DAYS Performed at [facility], [address], [place] [address], [place] [address]    Report Status PENDING  Incomplete  Resp panel by RT-PCR (RSV, Flu A&B, Covid) Anterior Nasal Swab     Status: Abnormal   Collection Time: 06/23/22  3:19 PM   Specimen: Anterior Nasal Swab  Result Value Ref Range Status   SARS Coronavirus 2 by RT PCR POSITIVE (A) NEGATIVE Final    Comment: (NOTE) SARS-CoV-2 target nucleic acids are DETECTED.  The SARS-CoV-2 RNA is generally detectable in upper respiratory specimens during the acute phase of infection. Positive results are indicative of the presence of the identified virus, but do not rule out bacterial infection or co-infection with other pathogens not detected by the test. Clinical correlation with patient history and other diagnostic information is necessary to determine patient infection status. The expected result is Negative.  Fact Sheet for Patients: EntrepreneurPulse.com.au  Fact Sheet for Healthcare Providers: IncredibleEmployment.be  This test is not yet approved or cleared by the [place] FDA and  has been authorized for detection and/or diagnosis of SARS-CoV-2 by FDA under an Emergency Use Authorization (EUA).  This EUA will remain in effect  (meaning this test can be used) for the duration of  the COVID-19 declaration under Section 564(b)(1) of the A ct, 21 U.S.C. section 360bbb-3(b)(1), unless the authorization is terminated or revoked sooner.     Influenza A by PCR NEGATIVE NEGATIVE Final   Influenza B by PCR  NEGATIVE NEGATIVE Final    Comment: (NOTE) The Xpert Xpress SARS-CoV-2/FLU/RSV plus assay is intended as an aid in the diagnosis of influenza from Nasopharyngeal swab specimens and should not be used as a sole basis for treatment. Nasal washings and aspirates are unacceptable for Xpert Xpress SARS-CoV-2/FLU/RSV testing.  Fact Sheet for Patients: EntrepreneurPulse.com.au  Fact Sheet for Healthcare Providers: IncredibleEmployment.be  This test is not yet approved or cleared by the [place] FDA and has been authorized for detection and/or diagnosis of SARS-CoV-2 by FDA under an Emergency Use Authorization (EUA). This EUA will remain in effect (meaning this test can be used) for the duration of the COVID-19 declaration under Section 564(b)(1) of the Act, 21 U.S.C. section 360bbb-3(b)(1), unless the authorization is terminated or revoked.     Resp Syncytial Virus by PCR NEGATIVE NEGATIVE Final    Comment: (NOTE) Fact Sheet for Patients: EntrepreneurPulse.com.au  Fact Sheet for Healthcare Providers: IncredibleEmployment.be  This test is not yet approved or cleared by the [place] FDA and has been authorized for detection and/or diagnosis of SARS-CoV-2 by FDA under an Emergency Use Authorization (EUA). This EUA will remain in effect (meaning this test can be used) for the duration of the COVID-19 declaration under Section 564(b)(1) of the Act, 21 U.S.C. section 360bbb-3(b)(1), unless the authorization is terminated or revoked.  Performed at [facility], [address], [place], [place] [address]   MRSA Next Gen by PCR, Nasal      Status: None   Collection Time: 06/24/22  5:39 PM   Specimen: Nasal Mucosa; Nasal Swab  Result Value Ref Range Status   MRSA by PCR Next Gen NOT DETECTED NOT DETECTED Final    Comment: (NOTE) The GeneXpert MRSA Assay (FDA approved for NASAL specimens only), is one component of a comprehensive MRSA colonization surveillance program. It is not intended to diagnose MRSA infection nor to guide or monitor treatment for MRSA infections. Test performance is not FDA approved in patients less than 37 years old. Performed at [facility], [address], [place], [place] [address]     Labs: CBC: Recent Labs  Lab 06/23/22 1240 06/24/22 0624 06/25/22 0000 06/26/22 0312 06/27/22 0539  WBC 8.0 7.8 9.4 10.4 8.0  NEUTROABS 5.4 6.8 7.7 8.8* 6.4  HGB 11.6* 11.5* 10.7* 10.3* 10.1*  HCT 39.3 39.7 35.5* 34.1* 33.2*  MCV 107.1* 108.8* 105.0* 104.9* 104.7*  PLT 332 318 306 274 283   Basic Metabolic Panel: Recent Labs  Lab 06/23/22 1240 06/24/22 0624 06/25/22 0000 06/26/22 0312 06/27/22 0539  NA 142 144 144 145 145  K 4.8 4.3 3.7 3.7 3.7  CL 100 99 99 99 100  CO2 33* 37* 33* 38* 38*  GLUCOSE 105* 161* 123* 126* 153*  BUN 13 16 24* 31* 36*  CREATININE 0.89 0.83 0.98 1.05 1.00  CALCIUM 8.4* 8.4* 8.5* 8.8* 8.8*  MG  --  2.1 2.0 2.4 2.3  PHOS  --  6.0* 2.7 2.7 3.0   Liver Function Tests: Recent Labs  Lab 06/23/22 1240 06/24/22 0624 06/25/22 0000 06/26/22 0312 06/27/22 0539  AST 32 _0 ALT _1 ALKPHOS 73 76 65 62 57  BILITOT 0.9 0.6 1.0 0.7 0.7  PROT 7.7 7.6 7.2 6.9 6.5  ALBUMIN 2.7* 2.7* 2.7* 2.6* 2.4*   CBG: Recent Labs  Lab 06/26/22 1616 06/26/22 1931 06/27/22 0043 06/27/22 0435 06/27/22 0709  GLUCAP 140* 121* 119* 133* 131*    Discharge time spent:  greater than 30 minutes.  Signed: Orson Eva, MD [facility] 06/27/2022

## 2022-06-27 NOTE — Care Management Important Message (Signed)
Important Message  Patient Details  Name: Bradley Roman MRN: [id number] Date of Birth: 1941-04-25   Medicare Important Message Given:  Other (see comment) (unable to reach by phone, unable to reach daughter  [name] by phone, copy mailed to address on file,)     [name] 06/27/2022, 1:10 PM

## 2022-06-27 NOTE — Progress Notes (Signed)
Report given to [facility].

## 2022-06-27 NOTE — Progress Notes (Signed)
Patient discharged to [facility] via EMS. All peripheral Ivs removed as well as the temp foley catheter.

## 2022-06-27 NOTE — NC FL2 (Signed)
[place] LEVEL OF CARE FORM     IDENTIFICATION  Patient Name: Bradley Roman Birthdate: [birth year] Sex: male Admission Date (Current Location): 06/23/2022  [facility] and [place] Number:  [employer] and Address:  [place] [address] [place]      Provider Number: [id number]  Attending Physician Name and Address:  Orson Eva, MD  Relative Name and Phone Number:       Current Level of Care: Hospital Recommended Level of Care: [place] Prior Approval Number:    Date Approved/Denied:   PASRR Number:    Discharge Plan: SNF    Current Diagnoses: Patient Active Problem List   Diagnosis Date Noted   Pulmonary embolus ([place]) 06/24/2022   Acute respiratory failure with hypoxia and hypercapnia ([place]) 06/24/2022   Pulmonary edema 06/24/2022   Bilateral pleural effusion 06/24/2022   Pulmonary fibrosis ([place]) 06/24/2022   Macrocytic anemia 06/24/2022   Iron deficiency anemia 06/24/2022   Altered mental status 06/24/2022   Pneumonia due to COVID-19 virus 06/23/2022   Postoperative anemia due to acute blood loss 07/26/2015   Obesity (BMI 30-39.9) 07/25/2015   Hip fracture, left ([place]) 07/24/2015   Hyperglycemia 07/24/2015   Dementia ([place]) 07/24/2015   Closed intertrochanteric fracture of left femur ([place]) 07/24/2015    Orientation RESPIRATION BLADDER Height & Weight     Self  O2 (see dc summary) Incontinent Weight: 228 lb 2.8 oz (103.5 kg) Height:  '5\' 6"'$  (167.6 cm)  BEHAVIORAL SYMPTOMS/MOOD NEUROLOGICAL BOWEL NUTRITION STATUS      Continent Diet (see dc summary)  AMBULATORY STATUS COMMUNICATION OF NEEDS Skin   Extensive Assist Verbally PU Stage and Appropriate Care (Sacrum, R heel, R tibia, Abdomin)   PU Stage 2 Dressing: Daily                   Personal Care Assistance Level of Assistance  Bathing, Feeding, Dressing Bathing Assistance: Maximum assistance Feeding assistance: Limited  assistance Dressing Assistance: Maximum assistance     Functional Limitations Info  Sight, Hearing, Speech Sight Info: Impaired Hearing Info: Impaired Speech Info: Impaired    SPECIAL CARE FACTORS FREQUENCY                       Contractures Contractures Info: Not present    Additional Factors Info  Code Status, Allergies Code Status Info: DNR Allergies Info: NKA           Current Medications (06/27/2022):  This is the current hospital active medication list Current Facility-Administered Medications  Medication Dose Route Frequency Provider Last Rate Last Admin   acetaminophen (TYLENOL) tablet 650 mg  650 mg Oral Q6H PRN Adefeso, Oladapo, DO       apixaban (ELIQUIS) tablet 10 mg  10 mg Oral BID Tat, Shanon Brow, MD   10 mg at 06/27/22 0938   Followed by   Derrill Memo ON 07/02/2022] apixaban (ELIQUIS) tablet 5 mg  5 mg Oral BID Tat, David, MD       Chlorhexidine Gluconate Cloth 2 % PADS 6 each  6 each Topical Daily Tat, David, MD   6 each at 06/27/22 0834   donepezil (ARICEPT) tablet 10 mg  10 mg Oral QHS Adefeso, Oladapo, DO   10 mg at 06/26/22 2031   insulin aspart (novoLOG) injection 0-9 Units  0-9 Units Subcutaneous Q4H Orson Eva, MD   1 Units at 06/27/22 0834   ipratropium-albuterol (DUONEB) 0.5-2.5 (3) MG/3ML nebulizer solution  3 mL  3 mL Nebulization Q6H Tat, Shanon Brow, MD   3 mL at 06/27/22 0739   ondansetron (ZOFRAN) tablet 4 mg  4 mg Oral Q6H PRN Adefeso, Oladapo, DO       Or   ondansetron (ZOFRAN) injection 4 mg  4 mg Intravenous Q6H PRN Adefeso, Oladapo, DO       pantoprazole (PROTONIX) injection 40 mg  40 mg Intravenous QHS Adefeso, Oladapo, DO   40 mg at 06/26/22 2031   predniSONE (DELTASONE) tablet 50 mg  50 mg Oral BID WC Orson Eva, MD         Discharge Medications: Please see discharge summary for a list of discharge medications.  Relevant Imaging Results:  Relevant Lab Results:   Additional Information    Shade Flood, LCSW

## 2022-06-27 NOTE — TOC Transition Note (Signed)
Transition of Care [facility]) - CM/SW Discharge Note   Patient Details  Name: Bradley Roman MRN: [id number] Date of Birth: [birth year]  [facility] [place] [facility] CM/SW Contact:  Shade Flood, LCSW Phone Number: 06/27/2022, 11:28 AM   Clinical Narrative:     Pt medically stable for dc back to [facility] today per MD. Dominica Severin at [facility]. Updated daughter who is in agreement with dc plan.  DC clinical sent electronically. RN to call report. EMS arranged.  No other TOC needs for dc.  Final next level of care: Skilled Nursing Facility Barriers to Discharge: Barriers Resolved   Patient Goals and CMS Choice        Discharge Placement                       Discharge Plan and Services                                     Social Determinants of Health (SDOH) Interventions     Readmission Risk Interventions     No data to display

## 2022-06-27 NOTE — Progress Notes (Signed)
Attempted report x1 to [facility] to discharge patient. Phone continuously rang with no answer after secretary transferred call to nurses' station. [phone]. [id number].

## 2022-06-28 LAB — CULTURE, BLOOD (ROUTINE X 2): Culture: NO GROWTH

## 2022-08-29 ENCOUNTER — Encounter (HOSPITAL_BASED_OUTPATIENT_CLINIC_OR_DEPARTMENT_OTHER): Payer: Commercial Managed Care - HMO | Admitting: Internal Medicine

## 2022-09-08 DEATH — deceased
